# Patient Record
Sex: Female | Born: 1985 | Race: White | Hispanic: No | Marital: Married | Smoking: Never smoker
Health system: Southern US, Community
[De-identification: ages and names within clinical notes are randomized; demographics above are authoritative.]

## PROBLEM LIST (undated history)

## (undated) ENCOUNTER — Inpatient Hospital Stay (HOSPITAL_COMMUNITY): Payer: Self-pay

## (undated) DIAGNOSIS — Z8619 Personal history of other infectious and parasitic diseases: Secondary | ICD-10-CM

## (undated) DIAGNOSIS — Z789 Other specified health status: Secondary | ICD-10-CM

## (undated) DIAGNOSIS — A6920 Lyme disease, unspecified: Secondary | ICD-10-CM

## (undated) HISTORY — DX: Personal history of other infectious and parasitic diseases: Z86.19

## (undated) HISTORY — PX: WISDOM TOOTH EXTRACTION: SHX21

---
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
## 2011-04-17 LAB — HM PAP SMEAR: HM Pap smear: NORMAL

## 2011-07-04 ENCOUNTER — Ambulatory Visit: Payer: Self-pay | Admitting: Obstetrics & Gynecology

## 2011-08-21 NOTE — L&D Delivery Note (Signed)
Delivery Note At 9:53 PM a viable female was delivered via Vaginal, Spontaneous Delivery (Presentation: ; Occiput Anterior).  APGAR: 9, 9; weight 7 lb 2.3 oz (3240 g).   Placenta status: Intact, Spontaneous.  Cord: 3 vessels with the following complications: None.    Anesthesia: Epidural  Episiotomy: none Lacerations: bilateral periurethral Suture Repair: 3.0 vicryl rapide SH Est. Blood Loss (mL): 200  Mom to postpartum.  Baby to nursery-stable.  Bauer, Karla Dietz 11/24/2011, 10:17 PM

## 2011-08-31 LAB — HIV ANTIBODY (ROUTINE TESTING W REFLEX): HIV: NONREACTIVE

## 2011-09-08 LAB — STREP B DNA PROBE: GBS: NEGATIVE

## 2011-11-23 ENCOUNTER — Encounter (HOSPITAL_COMMUNITY): Payer: Self-pay

## 2011-11-23 ENCOUNTER — Inpatient Hospital Stay (HOSPITAL_COMMUNITY)
Admission: AD | Admit: 2011-11-23 | Discharge: 2011-11-26 | DRG: 775 | Disposition: A | Discharge status: Home / Self Care | Source: Ambulatory Visit | Attending: Obstetrics | Admitting: Obstetrics

## 2011-11-23 DIAGNOSIS — IMO0002 Reserved for concepts with insufficient information to code with codable children: Secondary | ICD-10-CM

## 2011-11-23 HISTORY — DX: Other specified health status: Z78.9

## 2011-11-23 NOTE — Progress Notes (Signed)
Anice Paganini, CNM will special patient if she is admitted in active labor.  Otherwise, call Dr. Gaynell Face for orders.

## 2011-11-23 NOTE — MAU Note (Signed)
Pt is going to 169 to be evaluated due to limited beds in MAU-C.Karla Bauer is Contractor

## 2011-11-24 ENCOUNTER — Encounter (HOSPITAL_COMMUNITY): Payer: Self-pay | Admitting: Anesthesiology

## 2011-11-24 ENCOUNTER — Inpatient Hospital Stay (HOSPITAL_COMMUNITY): Admitting: Anesthesiology

## 2011-11-24 ENCOUNTER — Encounter (HOSPITAL_COMMUNITY): Payer: Self-pay | Admitting: *Deleted

## 2011-11-24 DIAGNOSIS — IMO0002 Reserved for concepts with insufficient information to code with codable children: Secondary | ICD-10-CM

## 2011-11-24 LAB — CBC
HCT: 35.6 % — ABNORMAL LOW (ref 36.0–46.0)
Hemoglobin: 12 g/dL (ref 12.0–15.0)
MCHC: 33.7 g/dL (ref 30.0–36.0)
RBC: 3.93 MIL/uL (ref 3.87–5.11)
WBC: 10.9 10*3/uL — ABNORMAL HIGH (ref 4.0–10.5)

## 2011-11-24 LAB — ABO/RH: ABO/RH(D): B POS

## 2011-11-24 LAB — RPR: RPR Ser Ql: NONREACTIVE

## 2011-11-24 LAB — TYPE AND SCREEN: ABO/RH(D): B POS

## 2011-11-24 LAB — HEPATITIS B SURFACE ANTIGEN: Hepatitis B Surface Ag: NEGATIVE

## 2011-11-24 LAB — RUBELLA SCREEN: Rubella: 48.3 IU/mL — ABNORMAL HIGH

## 2011-11-24 MED ORDER — IBUPROFEN 600 MG PO TABS
600.0000 mg | ORAL_TABLET | Freq: Four times a day (QID) | ORAL | Status: DC | PRN
  Administered 2011-11-24: 600 mg via ORAL
  Filled 2011-11-24: qty 1

## 2011-11-24 MED ORDER — PHENYLEPHRINE 40 MCG/ML (10ML) SYRINGE FOR IV PUSH (FOR BLOOD PRESSURE SUPPORT)
80.0000 ug | PREFILLED_SYRINGE | INTRAVENOUS | Status: DC | PRN
  Filled 2011-11-24: qty 5

## 2011-11-24 MED ORDER — OXYTOCIN BOLUS FROM INFUSION
500.0000 mL | Freq: Once | INTRAVENOUS | Status: DC
  Filled 2011-11-24 (×2): qty 1000
  Filled 2011-11-24: qty 500

## 2011-11-24 MED ORDER — LIDOCAINE HCL (PF) 1 % IJ SOLN
INTRAMUSCULAR | Status: DC | PRN
  Administered 2011-11-24 (×2): 4 mL

## 2011-11-24 MED ORDER — TERBUTALINE SULFATE 1 MG/ML IJ SOLN: 0.2500 mg | Freq: Once | INTRAMUSCULAR | Status: AC | PRN

## 2011-11-24 MED ORDER — PHENYLEPHRINE 40 MCG/ML (10ML) SYRINGE FOR IV PUSH (FOR BLOOD PRESSURE SUPPORT)
80.0000 ug | PREFILLED_SYRINGE | INTRAVENOUS | Status: DC | PRN
  Administered 2011-11-24: 40 ug via INTRAVENOUS

## 2011-11-24 MED ORDER — OXYCODONE-ACETAMINOPHEN 5-325 MG PO TABS: 1.0000 | ORAL_TABLET | ORAL | Status: DC | PRN

## 2011-11-24 MED ORDER — ACETAMINOPHEN 325 MG PO TABS: 650.0000 mg | ORAL_TABLET | ORAL | Status: DC | PRN

## 2011-11-24 MED ORDER — EPHEDRINE 5 MG/ML INJ: 10.0000 mg | INTRAVENOUS | Status: DC | PRN

## 2011-11-24 MED ORDER — FENTANYL 2.5 MCG/ML BUPIVACAINE 1/10 % EPIDURAL INFUSION (WH - ANES)
14.0000 mL/h | INTRAMUSCULAR | Status: DC
  Filled 2011-11-24: qty 60

## 2011-11-24 MED ORDER — CITRIC ACID-SODIUM CITRATE 334-500 MG/5ML PO SOLN: 30.0000 mL | ORAL | Status: DC | PRN

## 2011-11-24 MED ORDER — FLEET ENEMA 7-19 GM/118ML RE ENEM: 1.0000 | ENEMA | RECTAL | Status: DC | PRN

## 2011-11-24 MED ORDER — OXYTOCIN 20 UNITS IN LACTATED RINGERS INFUSION - SIMPLE
1.0000 m[IU]/min | INTRAVENOUS | Status: DC
  Administered 2011-11-24: 2 m[IU]/min via INTRAVENOUS
  Administered 2011-11-24: 4 m[IU]/min via INTRAVENOUS

## 2011-11-24 MED ORDER — FENTANYL 2.5 MCG/ML BUPIVACAINE 1/10 % EPIDURAL INFUSION (WH - ANES)
INTRAMUSCULAR | Status: DC | PRN
  Administered 2011-11-24: 14 mL/h via EPIDURAL

## 2011-11-24 MED ORDER — LACTATED RINGERS IV SOLN: 500.0000 mL | INTRAVENOUS | Status: DC | PRN

## 2011-11-24 MED ORDER — ONDANSETRON HCL 4 MG/2ML IJ SOLN: 4.0000 mg | Freq: Four times a day (QID) | INTRAMUSCULAR | Status: DC | PRN

## 2011-11-24 MED ORDER — DIPHENHYDRAMINE HCL 50 MG/ML IJ SOLN: 12.5000 mg | INTRAMUSCULAR | Status: DC | PRN

## 2011-11-24 MED ORDER — LACTATED RINGERS IV SOLN
INTRAVENOUS | Status: DC
  Administered 2011-11-24 (×2): via INTRAVENOUS
  Administered 2011-11-24: 500 mL via INTRAVENOUS

## 2011-11-24 MED ORDER — BUTORPHANOL TARTRATE 2 MG/ML IJ SOLN: 1.0000 mg | INTRAMUSCULAR | Status: DC | PRN

## 2011-11-24 MED ORDER — OXYTOCIN 20 UNITS IN LACTATED RINGERS INFUSION - SIMPLE
125.0000 mL/h | Freq: Once | INTRAVENOUS | Status: AC
  Administered 2011-11-24: 999 mL/h via INTRAVENOUS

## 2011-11-24 MED ORDER — LIDOCAINE HCL (PF) 1 % IJ SOLN
30.0000 mL | INTRAMUSCULAR | Status: DC | PRN
  Administered 2011-11-24: 30 mL via SUBCUTANEOUS
  Filled 2011-11-24: qty 30

## 2011-11-24 MED ORDER — EPHEDRINE 5 MG/ML INJ
10.0000 mg | INTRAVENOUS | Status: DC | PRN
  Filled 2011-11-24: qty 4

## 2011-11-24 NOTE — Progress Notes (Signed)
OK to do VS every couple of hours, temp every 2 hrs. Pt. Is attempting natural childbirth, trying different types of position - assuming different positions that will yeild the greatest amount of comfort (rocking chair, leaning over the head of the bed, sitting on the floor with spouse rubbing her back, standing & rocking; sitting on birthing ball. Spouse at the bedside providing physical and emotional support. [name], CNM at the bedside. Pt. Currently moving around in tub - trying to get comfortable.  Pain 7/10. FHR - 130's.

## 2011-11-24 NOTE — Anesthesia Preprocedure Evaluation (Signed)

## 2011-11-24 NOTE — Progress Notes (Signed)
Pt. Vomitting, mouth care provided, pt. Has decided she wants an epidural.  Anesthesia notified.

## 2011-11-24 NOTE — Progress Notes (Signed)
[name], midwife to the bedside, pt. Desires to go to bathtub and soak.  Telemetry monitors remain in place - waterproof. Pain level 7/10; natural childbirth.

## 2011-11-24 NOTE — Progress Notes (Signed)
CNM & RN assist pt. Getting out of tub and walking around in the room; gown and linen changed - dry monitor belts and gown given.  Spouse remains at the bedside. Pt. Undecided - may want epidural.

## 2011-11-24 NOTE — Progress Notes (Signed)
Karla Bauer is a 26 y.o. G1P0 at [redacted]w[redacted]d by LMP admitted for rupture of membranes  Subjective: Feeling rectal pressure and urge to push.   Objective: BP 101/65  Pulse 97  Temp(Src) 98.5 F (36.9 C) (Axillary)  Resp 18  Ht 5\' 5"  (1.651 m)  Wt 73.936 kg (163 lb)  BMI 27.12 kg/m2  SpO2 100%      FHT:  FHR: 130 bpm, variability: moderate,  accelerations:  Present,  decelerations:  Present variable UC:   regular, every 2 minutes, Pitocin at 80mu/min SVE:   Dilation: 10 Effacement (%): 100 Station: 0 Exam by:: [name], CNM  Labs: Lab Results  Component Value Date   WBC 10.9* 11/24/2011   HGB 12.0 11/24/2011   HCT 35.6* 11/24/2011   MCV 90.6 11/24/2011   PLT 150 11/24/2011    Assessment / Plan: Augmentation of labor, progressing well, ROM x 24hours, afebrile.   Labor: Progressing normally Preeclampsia:  no signs or symptoms of toxicity Fetal Wellbeing:  Category I Pain Control:  Epidural I/D:  n/a Anticipated MOD:  NSVD  [name], Kaydra Borgen 11/24/2011, 8:32 PM

## 2011-11-24 NOTE — H&P (Signed)
Karla Bauer is a 26 y.o. year old G1P0 female at [redacted]w[redacted]d weeks gestation who presents to MAU reporting Spontaneous rupture of membranes. Maternal Medical History:  Reason for admission: Reason for admission: rupture of membranes.  Reason for Admission:   nauseaContractions: Onset was 6-12 hours ago.   Frequency: irregular.   Perceived severity is mild.    Fetal activity: Perceived fetal activity is normal.   Last perceived fetal movement was within the past hour.    Prenatal complications: no prenatal complications Prenatal Complications - Diabetes: none.    OB History    Grav Para Term Preterm Abortions TAB SAB Ect Mult Living   1              Past Medical History  Diagnosis Date  . No pertinent past medical history    Past Surgical History  Procedure Date  . Plastic surgery on face  2001  . Wisdom tooth extraction    Family History: family history is not on file. Social History:  reports that she has never smoked. She does not have any smokeless tobacco history on file. She reports that she does not drink alcohol or use illicit drugs.  Review of Systems  Constitutional: Negative for fever.  Eyes: Negative for blurred vision.  Gastrointestinal: Positive for abdominal pain (intermittent contractions). Negative for nausea and vomiting.  Musculoskeletal: Positive for back pain (mild back pain).  Neurological: Negative for dizziness and headaches.    Dilation: 3 Effacement (%): 80 Station: -1 Exam by:: [name], CNM Blood pressure 124/84, pulse 89, temperature 97.9 F (36.6 C), temperature source Oral, resp. rate 18, height 5\' 5"  (1.651 m), weight 73.936 kg (163 lb). Maternal Exam:  Uterine Assessment: Contraction strength is mild.  Contraction frequency is irregular.  Contractions q8-21mins  Abdomen: Estimated fetal weight is 7lbs.   Fetal presentation: vertex  Introitus: Normal vulva. Normal vagina.  Amniotic fluid character: clear.  Pelvis: adequate for delivery.    Cervix: Cervix evaluated by digital exam.     Fetal Exam Fetal Monitor Review: Mode: ultrasound.   Baseline rate: 140.  Variability: moderate (6-25 bpm).   Pattern: accelerations present and no decelerations.       Physical Exam  Constitutional: She is oriented to person, place, and time. She appears well-developed and well-nourished. No distress.  HENT:  Head: Normocephalic and atraumatic.  Eyes: Pupils are equal, round, and reactive to light.  Neck: Normal range of motion.  Cardiovascular: Normal rate and regular rhythm.   Respiratory: Effort normal and breath sounds normal.  GI: Soft. Bowel sounds are normal. There is no tenderness.       Gravid  Genitourinary: Vagina normal and uterus normal.  Musculoskeletal: Normal range of motion.  Neurological: She is alert and oriented to person, place, and time. She has normal reflexes.  Skin: Skin is warm and dry.  Psychiatric: She has a normal mood and affect. Her behavior is normal. Judgment and thought content normal.    Prenatal labs: ABO, Rh: --/--/B POS (04/06 0300) Antibody: NEG (04/06 0249) Rubella:   RPR: NON REACTIVE (04/06 0100)  HBsAg:    HIV: Non-reactive (01/11 0000)  GBS: Negative (01/19 0000)   Assessment/Plan: Latent labor with SROM of clear fluid at 2030 last night. Will augment with Pitocin. Neg GBS.    [name], Masiya Claassen 11/24/2011, 7:41 AM

## 2011-11-24 NOTE — Progress Notes (Signed)
Karla Bauer is a 26 y.o. G1P0 at [redacted]w[redacted]d by LMP admitted for rupture of membranes  Subjective: Feeling some discomfort with contractions, frequent position changes. Currently alternating birthing ball and standing at bedside.   Objective: BP 113/81  Pulse 122  Temp(Src) 97.4 F (36.3 C) (Axillary)  Resp 18  Ht 5\' 5"  (1.651 m)  Wt 73.936 kg (163 lb)  BMI 27.12 kg/m2      FHT:  FHR: 135 bpm, variability: moderate,  accelerations:  Present,  decelerations:  Absent UC:   regular, every 3-5 minutes, Pitocin at 47mu/min SVE:   Dilation: 4 Effacement (%): 80 Station: -1 Exam by:: [name], CNM  Labs: Lab Results  Component Value Date   WBC 10.9* 11/24/2011   HGB 12.0 11/24/2011   HCT 35.6* 11/24/2011   MCV 90.6 11/24/2011   PLT 150 11/24/2011    Assessment / Plan: Augmentation of labor, progressing well  Labor: Progressing normally Preeclampsia:  no signs or symptoms of toxicity Fetal Wellbeing:  Category I Pain Control:  Labor support without medications I/D:  n/a Anticipated MOD:  NSVD  [name], Aydeen Blume 11/24/2011, 11:03 AM

## 2011-11-24 NOTE — Anesthesia Procedure Notes (Signed)
Epidural Patient location during procedure: OB Start time: 11/24/2011 6:26 PM  Staffing Anesthesiologist: Keyler Hoge A. Performed by: anesthesiologist   Preanesthetic Checklist Completed: patient identified, site marked, surgical consent, pre-op evaluation, timeout performed, IV checked, risks and benefits discussed and monitors and equipment checked  Epidural Patient position: sitting Prep: site prepped and draped and DuraPrep Patient monitoring: continuous pulse ox and blood pressure Approach: midline Injection technique: LOR air  Needle:  Needle type: Tuohy  Needle gauge: 17 G Needle length: 9 cm Needle insertion depth: 5 cm cm Catheter type: closed end flexible Catheter size: 19 Gauge Catheter at skin depth: 10 cm Test dose: negative and Other  Assessment Events: blood not aspirated, injection not painful, no injection resistance, negative IV test and no paresthesia  Additional Notes Patient identified. Risks and benefits discussed including failed block, incomplete  Pain control, post dural puncture headache, nerve damage, paralysis, blood pressure Changes, nausea, vomiting, reactions to medications-both toxic and allergic and post Partum back pain. All questions were answered. Patient expressed understanding and wished to proceed. Sterile technique was used throughout procedure. Epidural site was Dressed with sterile barrier dressing. No paresthesias, signs of intravascular injection Or signs of intrathecal spread were encountered.  Patient was more comfortable after the epidural was dosed. Please see RN's note for documentation of vital signs and FHR which are stable.

## 2011-11-24 NOTE — Progress Notes (Signed)
Karla Bauer is a 26 y.o. G1P0 at [redacted]w[redacted]d by LMP admitted for rupture of membranes  Subjective: Increased pain with contractions since AROM, frequent position changes. Desires to be checked.   Objective: BP 120/78  Pulse 80  Temp(Src) 97.4 F (36.3 C) (Oral)  Resp 20  Ht 5\' 5"  (1.651 m)  Wt 73.936 kg (163 lb)  BMI 27.12 kg/m2      FHT:  FHR: 140 bpm, variability: moderate,  accelerations:  Present,  decelerations:  Absent UC:   regular, every 2-3 minutes, Pitocin at 26mu/min SVE:   Dilation: 5 Effacement (%): 100 Station: -1 Exam by:: Bauer  Labs: Lab Results  Component Value Date   WBC 10.9* 11/24/2011   HGB 12.0 11/24/2011   HCT 35.6* 11/24/2011   MCV 90.6 11/24/2011   PLT 150 11/24/2011    Assessment / Plan: Augmentation of labor, progressing well, Pt up to tub, SVE 5-6cm.   Labor: Progressing normally Preeclampsia:  no signs or symptoms of toxicity Fetal Wellbeing:  Category I Pain Control:  Labor support without medications I/D:  n/a Anticipated MOD:  NSVD  Bauer, Karla Maris 11/24/2011, 4:30 PM

## 2011-11-24 NOTE — Progress Notes (Signed)
Karla Bauer is a 26 y.o. G1P0 at [redacted]w[redacted]d by LMP admitted for rupture of membranes  Subjective: Pt comfortable after epidural placement, feeling some rectal pressure now.   Objective: BP 93/76  Pulse 92  Temp(Src) 97.9 F (36.6 C) (Oral)  Resp 18  Ht 5\' 5"  (1.651 m)  Wt 73.936 kg (163 lb)  BMI 27.12 kg/m2  SpO2 100%      FHT:  FHR: 135 bpm, variability: moderate,  accelerations:  Present,  decelerations:  Absent UC:   regular, every 2-3 minutes, pitocin at 80mu/min SVE:   Dilation: 8 Effacement (%): 100 Station: 0 Exam by:: [name]  Labs: Lab Results  Component Value Date   WBC 10.9* 11/24/2011   HGB 12.0 11/24/2011   HCT 35.6* 11/24/2011   MCV 90.6 11/24/2011   PLT 150 11/24/2011    Assessment / Plan: Augmentation of labor, progressing well  Labor: Progressing normally Preeclampsia:  no signs or symptoms of toxicity Fetal Wellbeing:  Category I Pain Control:  Epidural I/D:  n/a Anticipated MOD:  NSVD  [name], Thomasine Klutts 11/24/2011, 7:12 PM

## 2011-11-24 NOTE — Progress Notes (Signed)
Karla Bauer is a 26 y.o. G1P0 at [redacted]w[redacted]d by LMP admitted for rupture of membranes  Subjective: Increasing discomfort with contractions.   Objective: BP 124/82  Pulse 120  Temp(Src) 97.4 F (36.3 C) (Axillary)  Resp 18  Ht 5\' 5"  (1.651 m)  Wt 73.936 kg (163 lb)  BMI 27.12 kg/m2      FHT:  FHR: 135 bpm, variability: moderate,  accelerations:  Present,  decelerations:  Absent UC:   regular, every 2-3 minutes SVE:   Dilation: 4.5 Effacement (%): 90 Station: -1 Exam by:: becky hamby, cnm  Labs: Lab Results  Component Value Date   WBC 10.9* 11/24/2011   HGB 12.0 11/24/2011   HCT 35.6* 11/24/2011   MCV 90.6 11/24/2011   PLT 150 11/24/2011    Assessment / Plan: Augmentation of labor, progressing well, Forebag AROM, clear fluid. Head well applied. Latent labor  Labor: Progressing normally Preeclampsia:  no signs or symptoms of toxicity Fetal Wellbeing:  Category I Pain Control:  Labor support without medications I/D:  n/a Anticipated MOD:  NSVD  [name], Terrian Sentell 11/24/2011, 2:01 PM

## 2011-11-25 ENCOUNTER — Encounter (HOSPITAL_COMMUNITY): Payer: Self-pay | Admitting: *Deleted

## 2011-11-25 LAB — CBC
Hemoglobin: 11.9 g/dL — ABNORMAL LOW (ref 12.0–15.0)
MCH: 30.4 pg (ref 26.0–34.0)
MCV: 90.6 fL (ref 78.0–100.0)
Platelets: 145 10*3/uL — ABNORMAL LOW (ref 150–400)
RBC: 3.92 MIL/uL (ref 3.87–5.11)
WBC: 15 10*3/uL — ABNORMAL HIGH (ref 4.0–10.5)

## 2011-11-25 MED ORDER — SENNOSIDES-DOCUSATE SODIUM 8.6-50 MG PO TABS
2.0000 | ORAL_TABLET | Freq: Every day | ORAL | Status: DC
  Administered 2011-11-25: 2 via ORAL

## 2011-11-25 MED ORDER — SODIUM CHLORIDE 0.9 % IV SOLN: 250.0000 mL | INTRAVENOUS | Status: DC | PRN

## 2011-11-25 MED ORDER — TETANUS-DIPHTH-ACELL PERTUSSIS 5-2.5-18.5 LF-MCG/0.5 IM SUSP: 0.5000 mL | Freq: Once | INTRAMUSCULAR | Status: DC

## 2011-11-25 MED ORDER — OXYCODONE-ACETAMINOPHEN 5-325 MG PO TABS
1.0000 | ORAL_TABLET | ORAL | Status: DC | PRN
  Administered 2011-11-26: 1 via ORAL
  Filled 2011-11-25: qty 1

## 2011-11-25 MED ORDER — DIPHENHYDRAMINE HCL 25 MG PO CAPS: 25.0000 mg | ORAL_CAPSULE | Freq: Four times a day (QID) | ORAL | Status: DC | PRN

## 2011-11-25 MED ORDER — BENZOCAINE-MENTHOL 20-0.5 % EX AERO: 1.0000 "application " | INHALATION_SPRAY | CUTANEOUS | Status: DC | PRN

## 2011-11-25 MED ORDER — BENZOCAINE-MENTHOL 20-0.5 % EX AERO
INHALATION_SPRAY | CUTANEOUS | Status: AC
  Filled 2011-11-25: qty 56

## 2011-11-25 MED ORDER — SODIUM CHLORIDE 0.9 % IJ SOLN: 3.0000 mL | Freq: Two times a day (BID) | INTRAMUSCULAR | Status: DC

## 2011-11-25 MED ORDER — SODIUM CHLORIDE 0.9 % IJ SOLN: 3.0000 mL | INTRAMUSCULAR | Status: DC | PRN

## 2011-11-25 MED ORDER — ONDANSETRON HCL 4 MG/2ML IJ SOLN: 4.0000 mg | INTRAMUSCULAR | Status: DC | PRN

## 2011-11-25 MED ORDER — OXYTOCIN 20 UNITS IN LACTATED RINGERS INFUSION - SIMPLE: 125.0000 mL/h | INTRAVENOUS | Status: DC | PRN

## 2011-11-25 MED ORDER — SIMETHICONE 80 MG PO CHEW: 80.0000 mg | CHEWABLE_TABLET | ORAL | Status: DC | PRN

## 2011-11-25 MED ORDER — PRENATAL MULTIVITAMIN CH
1.0000 | ORAL_TABLET | Freq: Every day | ORAL | Status: DC
  Administered 2011-11-25 – 2011-11-26 (×2): 1 via ORAL
  Filled 2011-11-25: qty 1

## 2011-11-25 MED ORDER — IBUPROFEN 600 MG PO TABS
600.0000 mg | ORAL_TABLET | Freq: Four times a day (QID) | ORAL | Status: DC
  Administered 2011-11-25 – 2011-11-26 (×5): 600 mg via ORAL
  Filled 2011-11-25 (×5): qty 1

## 2011-11-25 MED ORDER — ZOLPIDEM TARTRATE 5 MG PO TABS: 5.0000 mg | ORAL_TABLET | Freq: Every evening | ORAL | Status: DC | PRN

## 2011-11-25 MED ORDER — ONDANSETRON HCL 4 MG PO TABS: 4.0000 mg | ORAL_TABLET | ORAL | Status: DC | PRN

## 2011-11-25 MED ORDER — LANOLIN HYDROUS EX OINT: TOPICAL_OINTMENT | CUTANEOUS | Status: DC | PRN

## 2011-11-25 MED ORDER — DIBUCAINE 1 % RE OINT: 1.0000 "application " | TOPICAL_OINTMENT | RECTAL | Status: DC | PRN

## 2011-11-25 MED ORDER — WITCH HAZEL-GLYCERIN EX PADS: 1.0000 "application " | MEDICATED_PAD | CUTANEOUS | Status: DC | PRN

## 2011-11-25 NOTE — Progress Notes (Signed)
Patient ID: Karla Bauer, female   DOB: 1986-07-16, 26 y.o.   MRN: [id number] Postpartum day one Vital signs normal Fundus firm Lochia moderate No complaints

## 2011-11-25 NOTE — H&P (Signed)
  Newborn Admission [facility] [address] Szymczak is a  female infant born at Gestational Age: <None>.  Mother, This patient's mother is not on file., is a This patient's mother is not on file. This patient's mother is not on file.. This patient's mother is not on file. Prenatal labs: ABO, Rh: This patient's mother is not on file.This patient's mother is not on file. Antibody: This patient's mother is not on file. Rubella: This patient's mother is not on file. RPR: This patient's mother is not on file. HBsAg: This patient's mother is not on file. HIV: This patient's mother is not on file. GBS: This patient's mother is not on file. Prenatal care: good.  Pregnancy complications: none Delivery complications: Marland Kitchen Maternal antibiotics: This patient's mother is not on file. Route of delivery: . Apgar scores:  at 1 minute,  at 5 minutes.  ROM: , , , . Newborn Measurements:  Weight:  Length:  Head Circumference:  in Chest Circumference:  in Normalized weight-for-age data available only for age 63 to 20 years.  Objective: Blood pressure 123/72, pulse 64, temperature 98.3 F (36.8 C), temperature source Oral, resp. rate 16, height 5\' 5"  (1.651 m), weight 73.936 kg (163 lb), SpO2 100.00%, unknown if currently breastfeeding. Physical Exam:  Head: normal  Eyes: red reflex deferred  Ears: normal  Mouth/Oral: palate intact  Neck: normal  Chest/Lungs: normal  Heart/Pulse: no murmur Abdomen/Cord: non-distended  Genitalia: normal female  Skin & Color: normal  Neurological: +suck, grasp and moro reflex  Skeletal: clavicles palpated, no crepitus and no hip subluxation  Other:   Assessment and Plan: Patient Active Problem List  Diagnoses Date Noted  . SROM (spontaneous rupture of membranes) 11/24/2011    Normal newborn care Lactation to see mom Hearing screen and first hepatitis B vaccine prior to discharge  Wilber Bihari, MD  11/25/2011, 9:47 AM

## 2011-11-25 NOTE — Anesthesia Postprocedure Evaluation (Signed)
  Anesthesia Post-op Note  Patient: Karla Bauer  Procedure(s) Performed: * No procedures listed *  Patient Location: Mother/Baby  Anesthesia Type: Epidural  Level of Consciousness: awake  Airway and Oxygen Therapy: Patient Spontanous Breathing  Post-op Pain: mild  Post-op Assessment: Patient's Cardiovascular Status Stable and Respiratory Function Stable  Post-op Vital Signs: stable  Complications: No apparent anesthesia complications

## 2011-11-26 MED ORDER — IBUPROFEN 600 MG PO TABS: 600.0000 mg | ORAL_TABLET | Freq: Four times a day (QID) | ORAL | Status: DC

## 2011-11-26 MED ORDER — OXYCODONE-ACETAMINOPHEN 5-325 MG PO TABS: 1.0000 | ORAL_TABLET | ORAL | Status: AC | PRN

## 2011-11-26 NOTE — Discharge Summary (Signed)
Obstetric Discharge Summary Reason for Admission: onset of labor Prenatal Procedures: ultrasound Intrapartum Procedures: spontaneous vaginal delivery Postpartum Procedures: none Complications-Operative and Postpartum: none Hemoglobin  Date Value Range Status  11/25/2011 11.9* 12.0-15.0 (g/dL) Final     HCT  Date Value Range Status  11/25/2011 35.5* 36.0-46.0 (%) Final    Physical Exam:  General: alert and no distress Lochia: appropriate Uterine Fundus: firm Incision: healing well DVT Evaluation: No evidence of DVT seen on physical exam.  Discharge Diagnoses: Term Pregnancy-delivered  Discharge Information: Date: 11/26/2011 Activity: pelvic rest Diet: routine Medications: PNV, Ibuprofen, Colace and Percocet Condition: stable Instructions: refer to practice specific booklet Discharge to: home Follow-up Information    Schedule an appointment as soon as possible for a visit with Brock Bad, MD.   Contact information:   [address] [place] [address] [phone]          Newborn Data: Live born female  Birth Weight: 7 lb 2.3 oz (3240 g) APGAR: 9, 9  Home with mother.  Arrianna Catala A 11/26/2011, 8:15 AM

## 2011-11-26 NOTE — Progress Notes (Signed)
UR chart review completed.  

## 2011-11-26 NOTE — Progress Notes (Signed)
Post Partum Day 2 Subjective: no complaints  Objective: Blood pressure 114/73, pulse 63, temperature 98.2 F (36.8 C), temperature source Oral, resp. rate 18, height 5\' 5"  (1.651 m), weight 73.936 kg (163 lb), SpO2 100.00%, unknown if currently breastfeeding.  Physical Exam:  General: alert and no distress Lochia: appropriate Uterine Fundus: firm Incision: healing well DVT Evaluation: No evidence of DVT seen on physical exam.   Basename 11/25/11 0535 11/24/11 0100  HGB 11.9* 12.0  HCT 35.5* 35.6*    Assessment/Plan: Discharge home   LOS: 3 days   Chrystina Naff A 11/26/2011, 8:07 AM

## 2012-04-23 IMAGING — US US OB US >=[ID] SNGL FETUS
1 series · 17 of 28 positions shown · non-contrast
Comparison: none

REASON FOR EXAM: SMALLER THAN DATES
COMMENTS:

[Series 1: us ob us >=(id) sngl fetus · 17 of 95 slices shown]
[im 1/95]
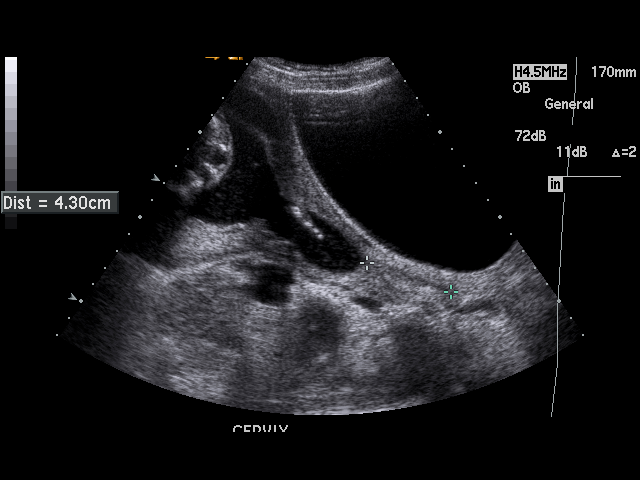
[im 7/95]
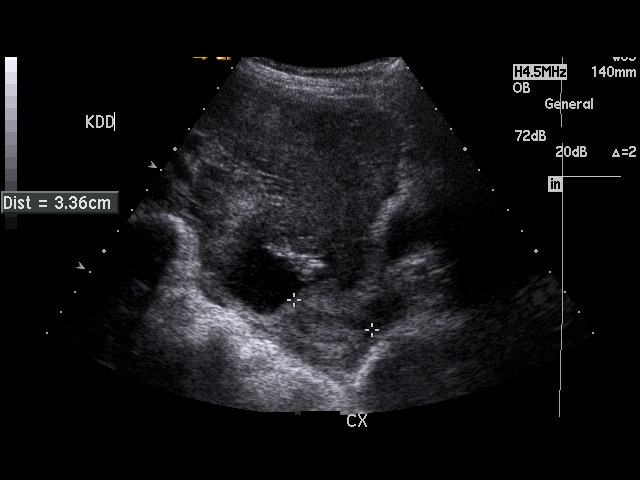
[im 14/95]
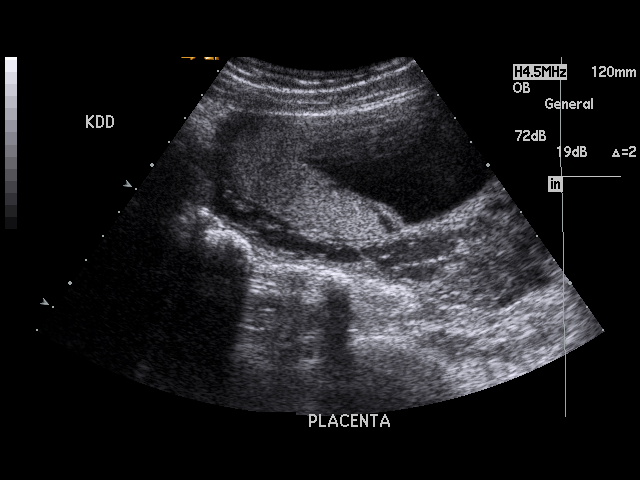
[im 18/95]
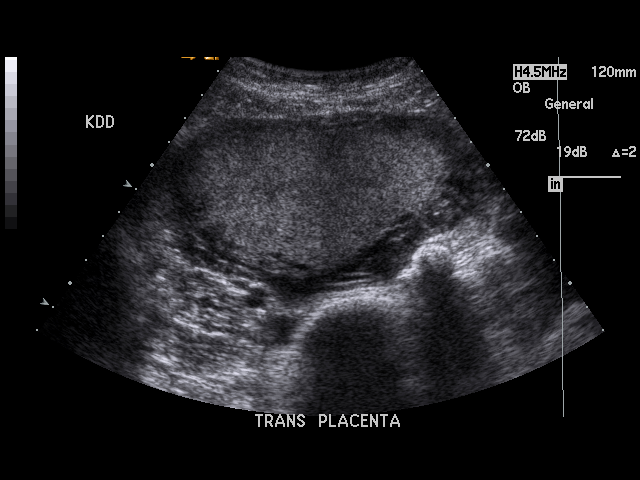
[im 25/95]
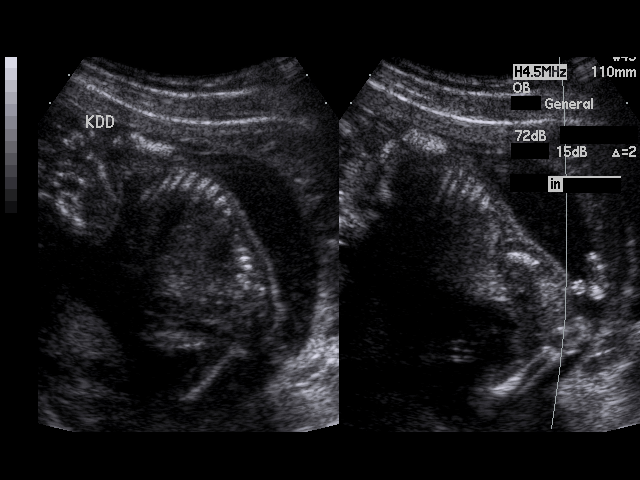
[im 32/95]
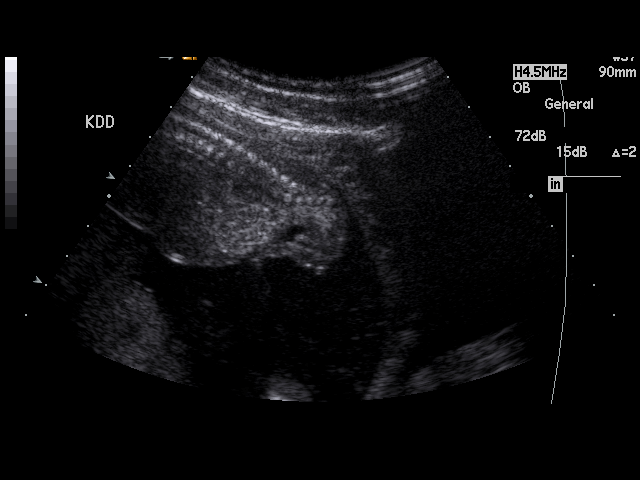
[im 35/95]
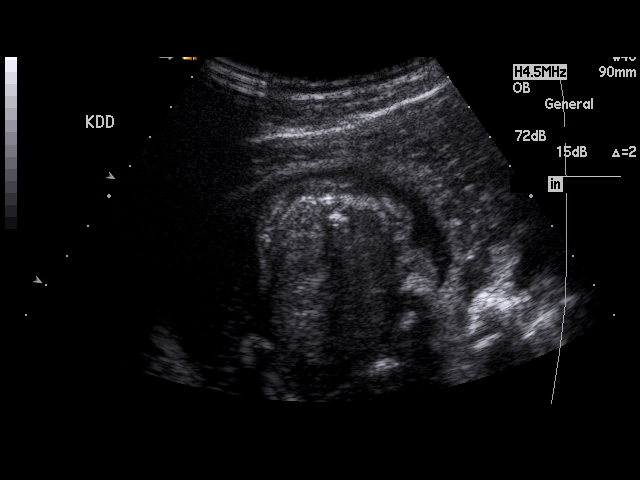
[im 42/95]
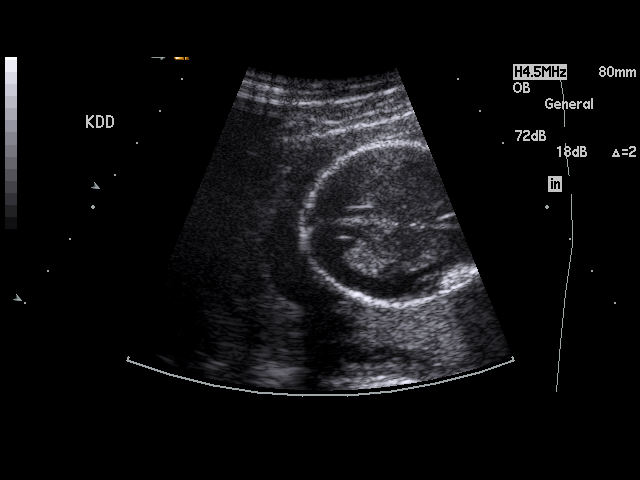
[im 49/95]
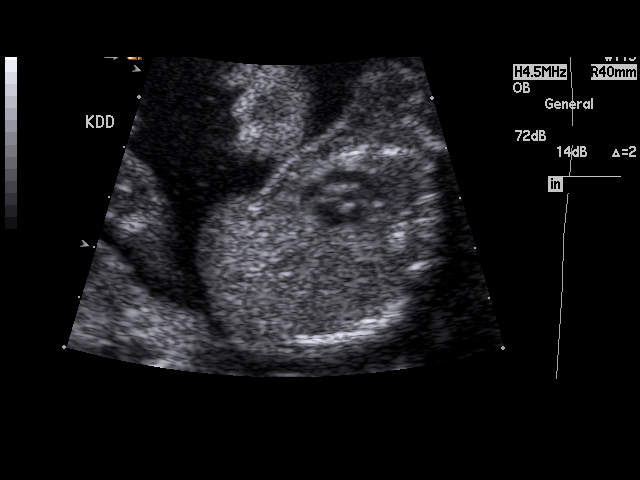
[im 53/95]
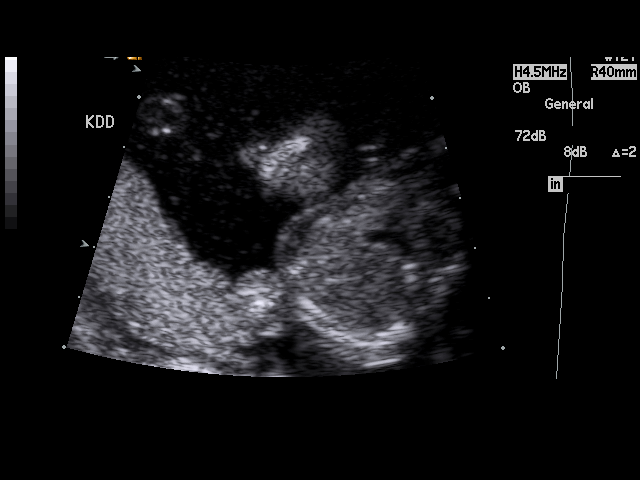
[im 60/95]
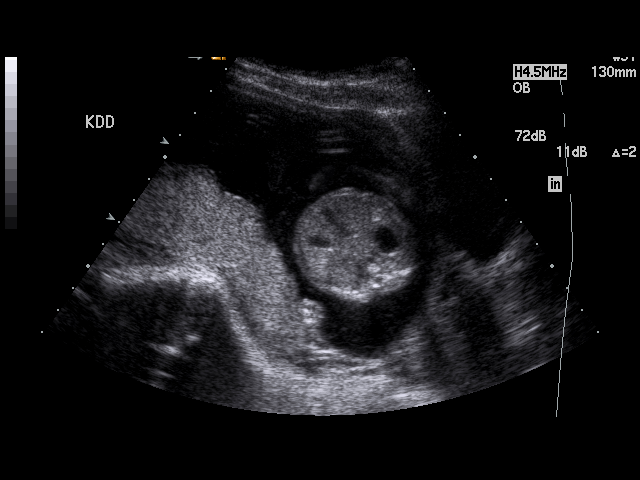
[im 63/95]
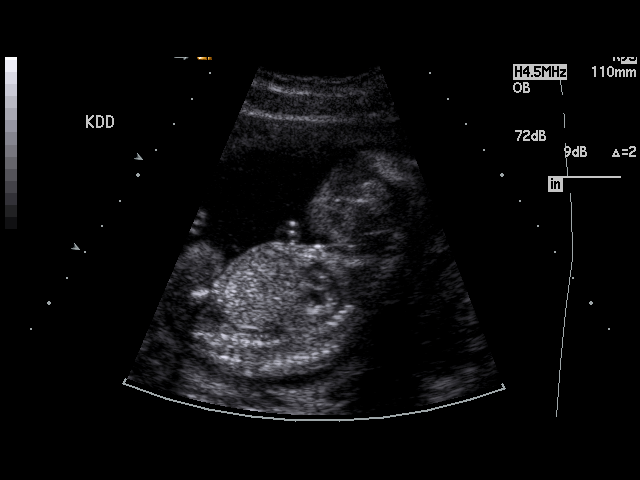
[im 70/95]
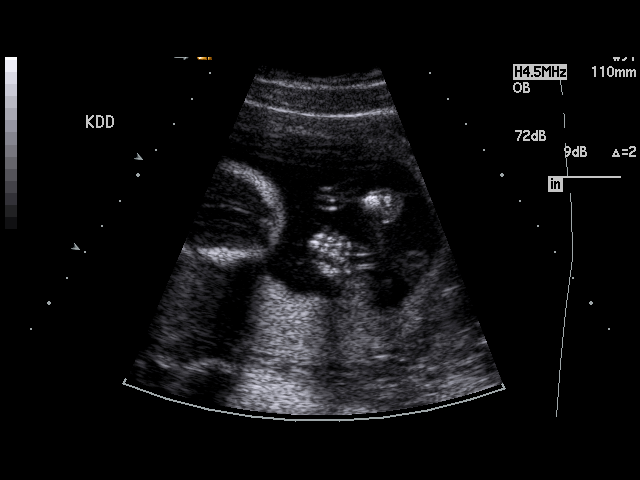
[im 77/95]
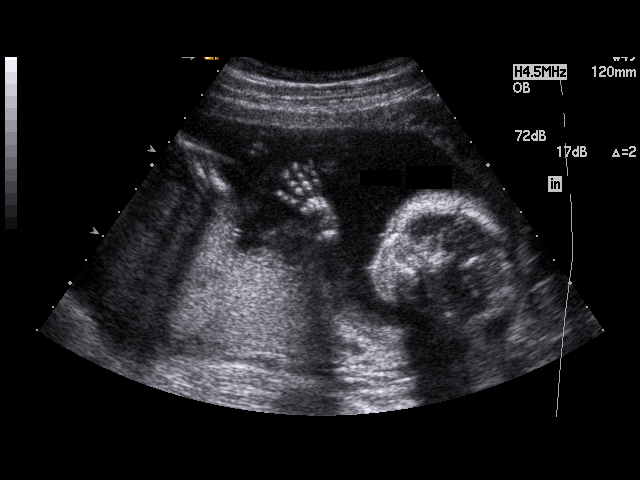
[im 81/95]
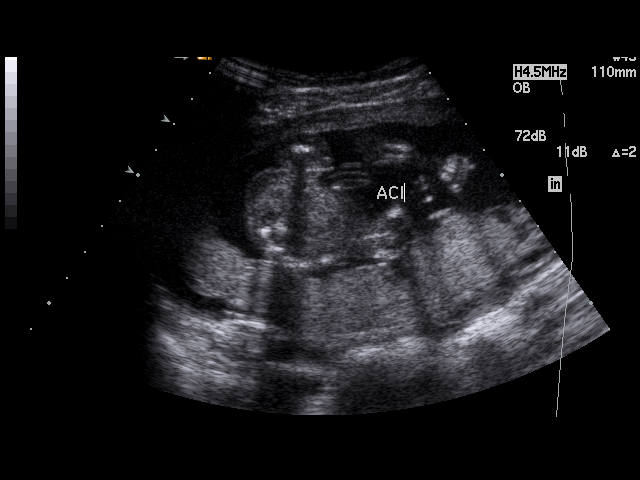
[im 88/95]
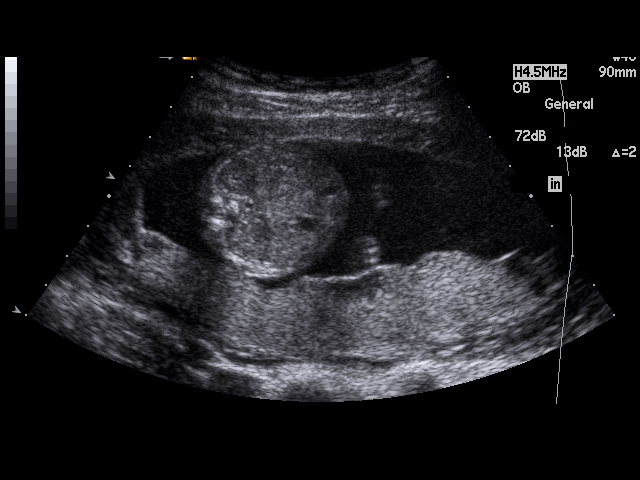
[im 95/95]
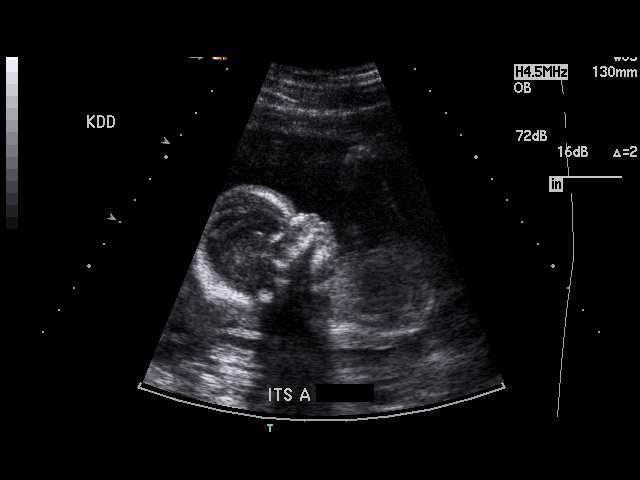

[17 of 28 positions shown; findings below may reference images not displayed]

PROCEDURE:     US  - US OB GREATER/OR EQUAL TO 66Z87  - July 04, 2011  [DATE]

RESULT:     There is observed a single living intrauterine gestation. Fetal
heart was monitored at 156 beats per minute. Presentation is breach. The
placenta is high and fundal in location. The inferior tip of the placenta is
approximately 5.78 cm from the cervix. Amnionic fluid volume appears normal.
The fetal heart, stomach, and urinary bladder are visualized. No
hydrocephalus or hydronephrosis is seen. No fetal abnormalities are
detected. Fetal measurements are as follows:

BPD: 4.14 cm corresponding to 18 weeks-7 days
HC: 15.57 cm corresponding to 18 weeks-7 days
AC: 13.06 cm corresponding to 18 weeks-7 days
FL: 2.67 cm corresponding to 18 weeks-9 day
HL: 2.54 cm corresponding to 18 weeks-3 days

EFW is 262 grams + / - 35 grams. Average ultrasound age is 18 weeks-9 days.
Ultrasound EDD is December 03, 2011.
IMPRESSION: 1.     Please see above.

## 2012-08-20 NOTE — L&D Delivery Note (Signed)
Delivery Note At 8:38 PM a viable female was delivered via Vaginal, Spontaneous Delivery (Presentation: Vertex-LOA ;  ).  APGAR: 9, 9; weight: 3289grams .   Placenta status: Intact, Spontaneous.  Cord: 3 vessels with the following complications: None.  Cord pH: none  Anesthesia: Epidural  Episiotomy: None Lacerations: 1st degree-supraurethral Suture Repair: 3.0 vicryl rapide Est. Blood Loss (mL): 350  Mom to postpartum.  Baby to Couplet care / Skin to Skin.  HARPER,CHARLES A 07/18/2013, 9:00 PM

## 2012-12-10 ENCOUNTER — Encounter: Payer: Self-pay | Admitting: Obstetrics & Gynecology

## 2012-12-10 ENCOUNTER — Ambulatory Visit (INDEPENDENT_AMBULATORY_CARE_PROVIDER_SITE_OTHER): Admitting: Obstetrics & Gynecology

## 2012-12-10 VITALS — BP 119/80 | Temp 97.0°F | Wt 122.0 lb

## 2012-12-10 DIAGNOSIS — Z3481 Encounter for supervision of other normal pregnancy, first trimester: Secondary | ICD-10-CM

## 2012-12-10 DIAGNOSIS — Z3201 Encounter for pregnancy test, result positive: Secondary | ICD-10-CM

## 2012-12-10 DIAGNOSIS — Z3480 Encounter for supervision of other normal pregnancy, unspecified trimester: Secondary | ICD-10-CM

## 2012-12-10 DIAGNOSIS — N912 Amenorrhea, unspecified: Secondary | ICD-10-CM

## 2012-12-10 LAB — POCT URINALYSIS DIPSTICK
Ketones, UA: NEGATIVE
Leukocytes, UA: NEGATIVE
Protein, UA: NEGATIVE
Urobilinogen, UA: NEGATIVE
pH, UA: 7

## 2012-12-10 LAB — OB RESULTS CONSOLE GC/CHLAMYDIA
Chlamydia: NEGATIVE
Gonorrhea: NEGATIVE

## 2012-12-10 LAB — POCT URINE PREGNANCY: Preg Test, Ur: POSITIVE

## 2012-12-10 MED ORDER — DOXYLAMINE-PYRIDOXINE 10-10 MG PO TBEC: 10.0000 mg | DELAYED_RELEASE_TABLET | Freq: Every evening | ORAL | Status: DC | PRN

## 2012-12-10 NOTE — Progress Notes (Signed)
Pulse-110  Subjective:    Karla Bauer is being seen today for her first obstetrical visit.  This is a planned pregnancy. She is at [redacted]w[redacted]d gestation. Relationship with FOB: spouse, living together. Patient does intend to breast feed. Pregnancy history fully reviewed.  Menstrual History: OB History   Grav Para Term Preterm Abortions TAB SAB Ect Mult Living   2 1 1       1       Menarche age: 27  Patient's last menstrual period was 10/17/2012.    The following portions of the patient's history were reviewed and updated as appropriate: allergies, current medications, past family history, past medical history, past social history, past surgical history and problem list.  Review of Systems Pertinent items are noted in HPI.    Objective:   General Appearance:    Alert, cooperative, no distress, appears stated age  Head:    Normocephalic, without obvious abnormality, atraumatic  Eyes:    PERRL, conjunctiva/corneas clear, EOM's intact, fundi    benign, both eyes  Ears:    Normal TM's and external ear canals, both ears  Nose:   Nares normal, septum midline, mucosa normal, no drainage    or sinus tenderness  Throat:   Lips, mucosa, and tongue normal; teeth and gums normal  Neck:   Supple, symmetrical, trachea midline, no adenopathy;    thyroid:  no enlargement/tenderness/nodules; no carotid   bruit or JVD  Back:     Symmetric, no curvature, ROM normal, no CVA tenderness  Lungs:     Clear to auscultation bilaterally, respirations unlabored  Chest Wall:    No tenderness or deformity   Heart:    Regular rate and rhythm, S1 and S2 normal, no murmur, rub   or gallop  Breast Exam:    No tenderness, masses, or nipple abnormality  Abdomen:     Soft, non-tender, bowel sounds active all four quadrants,    no masses, no organomegaly  Genitalia:    Normal female without lesion, discharge or tenderness  Extremities:   Extremities normal, atraumatic, no cyanosis or edema  Pulses:   2+ and symmetric  all extremities  Skin:   Skin color, texture, turgor normal, no rashes or lesions  Lymph nodes:   Cervical, supraclavicular, and axillary nodes normal  Neurologic:   CNII-XII intact, normal strength, sensation and reflexes    throughout    Assessment:    Pregnancy at [redacted]w[redacted]d weeks    Plan:    Initial labs drawn. Prenatal vitamins. Problem list reviewed and updated.  Follow up in 6 weeks. 50% of 20 min visit spent on counseling and coordination of care.   Marland Kitchen

## 2012-12-10 NOTE — Patient Instructions (Signed)
Hyperemesis Gravidarum  Hyperemesis gravidarum is a severe form of nausea and vomiting that happens during pregnancy. Hyperemesis is worse than morning sickness. It may cause a woman to have nausea or vomiting all day for many days. It may keep a woman from eating and drinking enough food and liquids. Hyperemesis usually occurs during the first half (the first 20 weeks) of pregnancy. It often goes away once a woman is in her second half of pregnancy. However, sometimes hyperemesis continues through an entire pregnancy.   CAUSES   The cause of this condition is not completely known but is thought to be due to changes in the body's hormones when pregnant. It could be the high level of the pregnancy hormone or an increase in estrogen in the body.   SYMPTOMS    Severe nausea and vomiting.   Nausea that does not go away.   Vomiting that does not allow you to keep any food down.   Weight loss and body fluid loss (dehydration).   Having no desire to eat or not liking food you have previously enjoyed.  DIAGNOSIS   Your caregiver may ask you about your symptoms. Your caregiver may also order blood tests and urine tests to make sure something else is not causing the problem.   TREATMENT   You may only need medicine to control the problem. If medicines do not control the nausea and vomiting, you will be treated in the hospital to prevent dehydration, acidosis, weight loss, and changes in the electrolytes in your body that may harm the unborn baby (fetus). You may need intravenous (IV) fluids.   HOME CARE INSTRUCTIONS    Take all medicine as directed by your caregiver.   Try eating a couple of dry crackers or toast in the morning before getting out of bed.   Avoid foods and smells that upset your stomach.   Avoid fatty and spicy foods. Eat 5 to 6 small meals a day.   Do not drink when eating meals. Drink between meals.   For snacks, eat high protein foods, such as cheese. Eat or suck on things that have ginger in  them. Ginger helps nausea.   Avoid food preparation. The smell of food can spoil your appetite.   Avoid iron pills and iron in your multivitamins until after 3 to 4 months of being pregnant.  SEEK MEDICAL CARE IF:    Your abdominal pain increases since the last time you saw your caregiver.   You have a severe headache.   You develop vision problems.   You feel you are losing weight.  SEEK IMMEDIATE MEDICAL CARE IF:    You are unable to keep fluids down.   You vomit blood.   You have constant nausea and vomiting.   You have a fever.   You have excessive weakness, dizziness, fainting, or extreme thirst.  MAKE SURE YOU:    Understand these instructions.   Will watch your condition.   Will get help right away if you are not doing well or get worse.  Document Released: 08/06/2005 Document Revised: 10/29/2011 Document Reviewed: 11/06/2010  [employer] Patient Information 2013 [employer].

## 2012-12-11 LAB — OBSTETRIC PANEL
Antibody Screen: NEGATIVE
Eosinophils Relative: 1 % (ref 0–5)
HCT: 41.9 % (ref 36.0–46.0)
Hemoglobin: 14.2 g/dL (ref 12.0–15.0)
Lymphocytes Relative: 17 % (ref 12–46)
Lymphs Abs: 1.6 10*3/uL (ref 0.7–4.0)
MCV: 88.4 fL (ref 78.0–100.0)
Monocytes Absolute: 0.6 10*3/uL (ref 0.1–1.0)
Monocytes Relative: 6 % (ref 3–12)
Neutro Abs: 7.1 10*3/uL (ref 1.7–7.7)
RDW: 13.4 % (ref 11.5–15.5)
Rh Type: POSITIVE
Rubella: 2.25 Index — ABNORMAL HIGH (ref <0.90)
WBC: 9.4 10*3/uL (ref 4.0–10.5)

## 2012-12-11 LAB — VITAMIN D 25 HYDROXY (VIT D DEFICIENCY, FRACTURES): Vit D, 25-Hydroxy: 35 ng/mL (ref 30–89)

## 2012-12-11 LAB — HIV ANTIBODY (ROUTINE TESTING W REFLEX): HIV: NONREACTIVE

## 2012-12-11 LAB — CULTURE, OB URINE
Colony Count: NO GROWTH
Organism ID, Bacteria: NO GROWTH

## 2012-12-12 LAB — PAP IG, CT-NG, RFX HPV ASCU: GC Probe Amp: NEGATIVE

## 2012-12-12 LAB — HEMOGLOBINOPATHY EVALUATION: Hgb F Quant: 0 % (ref 0.0–2.0)

## 2012-12-18 ENCOUNTER — Encounter: Payer: Self-pay | Admitting: Obstetrics & Gynecology

## 2013-01-07 ENCOUNTER — Ambulatory Visit (INDEPENDENT_AMBULATORY_CARE_PROVIDER_SITE_OTHER): Admitting: Obstetrics & Gynecology

## 2013-01-07 ENCOUNTER — Encounter: Payer: Self-pay | Admitting: Obstetrics & Gynecology

## 2013-01-07 VITALS — BP 107/73 | Temp 97.7°F | Wt 125.0 lb

## 2013-01-07 DIAGNOSIS — Z3481 Encounter for supervision of other normal pregnancy, first trimester: Secondary | ICD-10-CM

## 2013-01-07 DIAGNOSIS — Z3480 Encounter for supervision of other normal pregnancy, unspecified trimester: Secondary | ICD-10-CM

## 2013-01-07 LAB — POCT URINALYSIS DIPSTICK
Bilirubin, UA: NEGATIVE
Leukocytes, UA: NEGATIVE
Nitrite, UA: NEGATIVE
pH, UA: 5

## 2013-01-07 NOTE — Progress Notes (Signed)
Pulse- 101 

## 2013-02-05 ENCOUNTER — Encounter: Payer: Self-pay | Admitting: Obstetrics & Gynecology

## 2013-02-05 ENCOUNTER — Ambulatory Visit (INDEPENDENT_AMBULATORY_CARE_PROVIDER_SITE_OTHER): Admitting: Obstetrics & Gynecology

## 2013-02-05 VITALS — BP 104/68 | Temp 97.9°F | Wt 128.2 lb

## 2013-02-05 DIAGNOSIS — Z3480 Encounter for supervision of other normal pregnancy, unspecified trimester: Secondary | ICD-10-CM

## 2013-02-05 DIAGNOSIS — Z3482 Encounter for supervision of other normal pregnancy, second trimester: Secondary | ICD-10-CM

## 2013-02-05 LAB — POCT URINALYSIS DIPSTICK
Glucose, UA: NEGATIVE
Nitrite, UA: NEGATIVE
Urobilinogen, UA: NEGATIVE
pH, UA: 5

## 2013-02-05 NOTE — Progress Notes (Signed)
Pulse: 92

## 2013-02-05 NOTE — Patient Instructions (Addendum)
Pregnancy - Second Trimester The second trimester is the period between 13 to 27 weeks of your pregnancy. It is important to follow your doctor's instructions. HOME CARE   Do not smoke.  Do not drink alcohol or use drugs.  Only take medicine as told by your doctor.  Take prenatal vitamins as told. The vitamin should contain 1 milligram of folic acid.  Exercise.  Eat healthy foods. Eat regular, well-balanced meals.  You can have sex (intercourse) if there are no other problems with the pregnancy.  Do not use hot tubs, steam rooms, or saunas.  Wear a seat belt while driving.  Avoid raw meat, uncooked cheese, and litter boxes and soil used by cats.  Visit your dentist. Cleanings are okay. GET HELP RIGHT AWAY IF:   You have a temperature by mouth above 102 F (38.9 C), not controlled by medicine.  Fluid is coming from your vagina.  Blood is coming from your vagina. Light spotting is common, especially after sex (intercourse).  You have a bad smelling fluid (discharge) coming from the vagina. The fluid changes from clear to white.  You still feel sick to your stomach (nauseous).  You throw up (vomit) blood.  You lose or gain more than 2 pounds (0.9 kilograms) of weight in a week, or as suggested by your doctor.  Your face, hands, feet, or legs get puffy (swell).  You get exposed to German measles and have never had them.  You get exposed to fifth disease or chickenpox.  You have belly (abdominal) pain.  You have a bad headache that will not go away.  You have watery poop (diarrhea), pain when you pee (urinate), or have shortness of breath.  You start to have problems seeing (blurry or double vision).  You fall, are in a car accident, or have any kind of trauma.  There is mental or physical violence at home.  You have any concerns or worries during your pregnancy. MAKE SURE YOU:   Understand these instructions.  Will watch your condition.  Will get help  right away if you are not doing well or get worse. Document Released: 10/31/2009 Document Revised: 10/29/2011 Document Reviewed: 10/31/2009 [employer] Patient Information 2014 [employer].  

## 2013-02-10 NOTE — Progress Notes (Signed)
Doing well 

## 2013-02-22 ENCOUNTER — Other Ambulatory Visit: Payer: Self-pay | Admitting: *Deleted

## 2013-02-22 DIAGNOSIS — O0990 Supervision of high risk pregnancy, unspecified, unspecified trimester: Secondary | ICD-10-CM

## 2013-02-25 ENCOUNTER — Other Ambulatory Visit

## 2013-02-27 ENCOUNTER — Ambulatory Visit (HOSPITAL_COMMUNITY)
Admission: RE | Admit: 2013-02-27 | Discharge: 2013-02-27 | Disposition: A | Discharge status: Home / Self Care | Source: Ambulatory Visit | Attending: Obstetrics & Gynecology | Admitting: Obstetrics & Gynecology

## 2013-02-27 DIAGNOSIS — O0990 Supervision of high risk pregnancy, unspecified, unspecified trimester: Secondary | ICD-10-CM

## 2013-02-27 DIAGNOSIS — Z3689 Encounter for other specified antenatal screening: Secondary | ICD-10-CM | POA: Insufficient documentation

## 2013-03-09 ENCOUNTER — Ambulatory Visit (INDEPENDENT_AMBULATORY_CARE_PROVIDER_SITE_OTHER): Admitting: Obstetrics & Gynecology

## 2013-03-09 VITALS — BP 102/65 | Temp 98.0°F | Wt 135.0 lb

## 2013-03-09 DIAGNOSIS — Z3482 Encounter for supervision of other normal pregnancy, second trimester: Secondary | ICD-10-CM

## 2013-03-09 DIAGNOSIS — Z3480 Encounter for supervision of other normal pregnancy, unspecified trimester: Secondary | ICD-10-CM

## 2013-03-09 LAB — POCT URINALYSIS DIPSTICK
Blood, UA: NEGATIVE
Protein, UA: NEGATIVE
Spec Grav, UA: 1.01
Urobilinogen, UA: NEGATIVE

## 2013-03-09 NOTE — Progress Notes (Signed)
P- 97 Pt states she is having 1-2 episodes of tightening every once in a while.

## 2013-03-09 NOTE — Patient Instructions (Addendum)
Glucose Tolerance Test This is a test to see how your body processes carbohydrates. This test is often done to check patients for diabetes or the possibility of developing it. PREPARATION FOR TEST You should have nothing to eat or drink 12 hours before the test. You will be given a form of sugar (glucose) and then blood samples will be drawn from your vein to determine the level of sugar in your blood. Alternatively, blood may be drawn from your finger for testing. You should not smoke or exercise during the test. NORMAL FINDINGS  Fasting: 70-115 mg/dL  30 minutes: less than 200 mg/dL  1 hour: less than 200 mg/dL  2 hours: less than 140 mg/dL  3 hours: 70-115 mg/dL  4 hours: 70-115 mg/dL Ranges for normal findings may vary among different laboratories and hospitals. You should always check with your doctor after having lab work or other tests done to discuss the meaning of your test results and whether your values are considered within normal limits. MEANING OF TEST Your caregiver will go over the test results with you and discuss the importance and meaning of your results, as well as treatment options and the need for additional tests. OBTAINING THE TEST RESULTS It is your responsibility to obtain your test results. Ask the lab or department performing the test when and how you will get your results. Document Released: 08/29/2004 Document Revised: 10/29/2011 Document Reviewed: 07/17/2008 [employer] Patient Information 2014 [employer]. Tetanus, Diphtheria, Pertussis (Tdap) Vaccine What You Need to Know WHY GET VACCINATED? Tetanus, diphtheria and pertussis can be very serious diseases, even for adolescents and adults. Tdap vaccine can protect us from these diseases. TETANUS (Lockjaw) causes painful muscle tightening and stiffness, usually all over the body.  It can lead to tightening of muscles in the head and neck so you can't open your mouth, swallow, or sometimes even breathe.  Tetanus kills about 1 out of 5 people who are infected. DIPHTHERIA can cause a thick coating to form in the back of the throat.  It can lead to breathing problems, paralysis, heart failure, and death. PERTUSSIS (Whooping Cough) causes severe coughing spells, which can cause difficulty breathing, vomiting and disturbed sleep.  It can also lead to weight loss, incontinence, and rib fractures. Up to 2 in 100 adolescents and 5 in 100 adults with pertussis are hospitalized or have complications, which could include pneumonia and death. These diseases are caused by bacteria. Diphtheria and pertussis are spread from person to person through coughing or sneezing. Tetanus enters the body through cuts, scratches, or wounds. Before vaccines, the [place] saw as many as 200,000 cases a year of diphtheria and pertussis, and hundreds of cases of tetanus. Since vaccination began, tetanus and diphtheria have dropped by about 99% and pertussis by about 80%. TDAP VACCINE Tdap vaccine can protect adolescents and adults from tetanus, diphtheria, and pertussis. One dose of Tdap is routinely given at age 11 or 12. People who did not get Tdap at that age should get it as soon as possible. Tdap is especially important for health care professionals and anyone having close contact with a baby younger than 12 months. Pregnant women should get a dose of Tdap during every pregnancy, to protect the newborn from pertussis. Infants are most at risk for severe, life-threatening complications from pertussis. A similar vaccine, called Td, protects from tetanus and diphtheria, but not pertussis. A Td booster should be given every 10 years. Tdap may be given as one of these boosters   if you have not already gotten a dose. Tdap may also be given after a severe cut or burn to prevent tetanus infection. Your doctor can give you more information. Tdap may safely be given at the same time as other vaccines. SOME PEOPLE SHOULD NOT GET  THIS VACCINE  If you ever had a life-threatening allergic reaction after a dose of any tetanus, diphtheria, or pertussis containing vaccine, OR if you have a severe allergy to any part of this vaccine, you should not get Tdap. Tell your doctor if you have any severe allergies.  If you had a coma, or long or multiple seizures within 7 days after a childhood dose of DTP or DTaP, you should not get Tdap, unless a cause other than the vaccine was found. You can still get Td.  Talk to your doctor if you:  have epilepsy or another nervous system problem,  had severe pain or swelling after any vaccine containing diphtheria, tetanus or pertussis,  ever had Guillain-Barr Syndrome (GBS),  aren't feeling well on the day the shot is scheduled. RISKS OF A VACCINE REACTION With any medicine, including vaccines, there is a chance of side effects. These are usually mild and go away on their own, but serious reactions are also possible. Brief fainting spells can follow a vaccination, leading to injuries from falling. Sitting or lying down for about 15 minutes can help prevent these. Tell your doctor if you feel dizzy or light-headed, or have vision changes or ringing in the ears. Mild problems following Tdap (Did not interfere with activities)  Pain where the shot was given (about 3 in 4 adolescents or 2 in 3 adults)  Redness or swelling where the shot was given (about 1 person in 5)  Mild fever of at least 100.4F (up to about 1 in 25 adolescents or 1 in 100 adults)  Headache (about 3 or 4 people in 10)  Tiredness (about 1 person in 3 or 4)  Nausea, vomiting, diarrhea, stomach ache (up to 1 in 4 adolescents or 1 in 10 adults)  Chills, body aches, sore joints, rash, swollen glands (uncommon) Moderate problems following Tdap (Interfered with activities, but did not require medical attention)  Pain where the shot was given (about 1 in 5 adolescents or 1 in 100 adults)  Redness or swelling where  the shot was given (up to about 1 in 16 adolescents or 1 in 25 adults)  Fever over 102F (about 1 in 100 adolescents or 1 in 250 adults)  Headache (about 3 in 20 adolescents or 1 in 10 adults)  Nausea, vomiting, diarrhea, stomach ache (up to 1 or 3 people in 100)  Swelling of the entire arm where the shot was given (up to about 3 in 100). Severe problems following Tdap (Unable to perform usual activities, required medical attention)  Swelling, severe pain, bleeding and redness in the arm where the shot was given (rare). A severe allergic reaction could occur after any vaccine (estimated less than 1 in a million doses). WHAT IF THERE IS A SERIOUS REACTION? What should I look for?  Look for anything that concerns you, such as signs of a severe allergic reaction, very high fever, or behavior changes. Signs of a severe allergic reaction can include hives, swelling of the face and throat, difficulty breathing, a fast heartbeat, dizziness, and weakness. These would start a few minutes to a few hours after the vaccination. What should I do?  If you think it is a severe allergic reaction   or other emergency that can't wait, call 9-1-1 or get the person to the nearest hospital. Otherwise, call your doctor.  Afterward, the reaction should be reported to the "Vaccine Adverse Event Reporting System" (VAERS). Your doctor might file this report, or you can do it yourself through the VAERS web site at www.vaers.hhs.gov, or by calling 1-800-822-7967. VAERS is only for reporting reactions. They do not give medical advice.  THE NATIONAL VACCINE INJURY COMPENSATION PROGRAM The National Vaccine Injury Compensation Program (VICP) is a federal program that was created to compensate people who may have been injured by certain vaccines. Persons who believe they may have been injured by a vaccine can learn about the program and about filing a claim by calling 1-800-338-2382 or visiting the VICP website at  www.hrsa.gov/vaccinecompensation. HOW CAN I LEARN MORE?  Ask your doctor.  Call your local or state health department.  Contact the Centers for Disease Control and Prevention (CDC):  Call 1-800-232-4636 or visit CDC's website at www.cdc.gov/vaccines. CDC Tdap Vaccine VIS (12/27/11) Document Released: 02/05/2012 Document Revised: 04/30/2012 Document Reviewed: 02/05/2012 [employer] Patient Information 2014 [employer].  

## 2013-03-10 ENCOUNTER — Encounter: Payer: Self-pay | Admitting: Obstetrics & Gynecology

## 2013-03-10 NOTE — Progress Notes (Signed)
Doing well 

## 2013-04-01 ENCOUNTER — Encounter: Admitting: Obstetrics & Gynecology

## 2013-04-01 ENCOUNTER — Other Ambulatory Visit

## 2013-04-07 ENCOUNTER — Ambulatory Visit (INDEPENDENT_AMBULATORY_CARE_PROVIDER_SITE_OTHER): Admitting: Obstetrics

## 2013-04-07 ENCOUNTER — Encounter: Payer: Self-pay | Admitting: Obstetrics

## 2013-04-07 VITALS — BP 102/74 | Temp 97.1°F | Wt 139.0 lb

## 2013-04-07 DIAGNOSIS — Z3482 Encounter for supervision of other normal pregnancy, second trimester: Secondary | ICD-10-CM

## 2013-04-07 DIAGNOSIS — Z3480 Encounter for supervision of other normal pregnancy, unspecified trimester: Secondary | ICD-10-CM

## 2013-04-07 LAB — POCT URINALYSIS DIPSTICK
Ketones, UA: NEGATIVE
Leukocytes, UA: NEGATIVE
Spec Grav, UA: 1.02
Urobilinogen, UA: NEGATIVE
pH, UA: 6

## 2013-04-07 NOTE — Progress Notes (Signed)
Pulse 103, patient state she has no concerns

## 2013-04-28 ENCOUNTER — Other Ambulatory Visit

## 2013-04-28 ENCOUNTER — Encounter: Payer: Self-pay | Admitting: Obstetrics

## 2013-04-28 ENCOUNTER — Ambulatory Visit (INDEPENDENT_AMBULATORY_CARE_PROVIDER_SITE_OTHER): Admitting: Obstetrics

## 2013-04-28 VITALS — BP 113/77 | Temp 97.7°F | Wt 141.6 lb

## 2013-04-28 DIAGNOSIS — Z3483 Encounter for supervision of other normal pregnancy, third trimester: Secondary | ICD-10-CM

## 2013-04-28 DIAGNOSIS — Z3480 Encounter for supervision of other normal pregnancy, unspecified trimester: Secondary | ICD-10-CM

## 2013-04-28 LAB — POCT URINALYSIS DIPSTICK
Bilirubin, UA: NEGATIVE
Blood, UA: NEGATIVE
Ketones, UA: NEGATIVE
pH, UA: 7

## 2013-04-28 NOTE — Progress Notes (Signed)
Pulse 90, pt c/o increase in braxton hicks contractions

## 2013-04-28 NOTE — Progress Notes (Signed)
PTL precautions given

## 2013-05-14 ENCOUNTER — Ambulatory Visit (INDEPENDENT_AMBULATORY_CARE_PROVIDER_SITE_OTHER): Admitting: Obstetrics & Gynecology

## 2013-05-14 VITALS — BP 111/69 | Temp 97.8°F | Wt 143.6 lb

## 2013-05-14 DIAGNOSIS — Z23 Encounter for immunization: Secondary | ICD-10-CM

## 2013-05-14 DIAGNOSIS — Z3482 Encounter for supervision of other normal pregnancy, second trimester: Secondary | ICD-10-CM

## 2013-05-14 DIAGNOSIS — Z3480 Encounter for supervision of other normal pregnancy, unspecified trimester: Secondary | ICD-10-CM

## 2013-05-14 LAB — POCT URINALYSIS DIPSTICK
Blood, UA: NEGATIVE
Glucose, UA: NEGATIVE
Ketones, UA: NEGATIVE
Spec Grav, UA: 1.02

## 2013-05-14 NOTE — Progress Notes (Signed)
Pulse- 94. CBG - 87. Doing well.

## 2013-05-15 ENCOUNTER — Encounter: Payer: Self-pay | Admitting: Obstetrics & Gynecology

## 2013-05-15 NOTE — Patient Instructions (Signed)

## 2013-05-27 ENCOUNTER — Encounter: Payer: Self-pay | Admitting: Obstetrics & Gynecology

## 2013-05-28 ENCOUNTER — Ambulatory Visit (INDEPENDENT_AMBULATORY_CARE_PROVIDER_SITE_OTHER): Admitting: Obstetrics & Gynecology

## 2013-05-28 VITALS — BP 118/78 | Temp 98.0°F | Wt 148.0 lb

## 2013-05-28 DIAGNOSIS — Z3480 Encounter for supervision of other normal pregnancy, unspecified trimester: Secondary | ICD-10-CM

## 2013-05-28 DIAGNOSIS — Z3483 Encounter for supervision of other normal pregnancy, third trimester: Secondary | ICD-10-CM

## 2013-05-28 LAB — POCT URINALYSIS DIPSTICK
Bilirubin, UA: NEGATIVE
Glucose, UA: NEGATIVE
Ketones, UA: NEGATIVE
Leukocytes, UA: NEGATIVE
Protein, UA: NEGATIVE
Spec Grav, UA: 1.02

## 2013-05-28 NOTE — Progress Notes (Signed)
P 80 Patient reports she had some mild contraction over the weekend. Patient has some pain and pressure that seems normal at this time.

## 2013-05-29 ENCOUNTER — Encounter: Payer: Self-pay | Admitting: Obstetrics & Gynecology

## 2013-06-11 ENCOUNTER — Ambulatory Visit (INDEPENDENT_AMBULATORY_CARE_PROVIDER_SITE_OTHER): Admitting: Obstetrics & Gynecology

## 2013-06-11 ENCOUNTER — Encounter: Payer: Self-pay | Admitting: Obstetrics & Gynecology

## 2013-06-11 VITALS — BP 113/74 | Temp 98.1°F | Wt 150.0 lb

## 2013-06-11 DIAGNOSIS — Z3483 Encounter for supervision of other normal pregnancy, third trimester: Secondary | ICD-10-CM

## 2013-06-11 DIAGNOSIS — Z3480 Encounter for supervision of other normal pregnancy, unspecified trimester: Secondary | ICD-10-CM

## 2013-06-11 LAB — POCT URINALYSIS DIPSTICK
Bilirubin, UA: NEGATIVE
Glucose, UA: NEGATIVE
Ketones, UA: NEGATIVE
Nitrite, UA: NEGATIVE
pH, UA: 7

## 2013-06-11 NOTE — Patient Instructions (Signed)
Contraception Choices  Contraception (birth control) is the use of any methods or devices to prevent pregnancy. Below are some methods to help avoid pregnancy.  HORMONAL METHODS   · Contraceptive implant. This is a thin, plastic tube containing progesterone hormone. It does not contain estrogen hormone. Your caregiver inserts the tube in the inner part of the upper arm. The tube can remain in place for up to 3 years. After 3 years, the implant must be removed. The implant prevents the ovaries from releasing an egg (ovulation), thickens the cervical mucus which prevents sperm from entering the uterus, and thins the lining of the inside of the uterus.  · Progesterone-only injections. These injections are given every 3 months by your caregiver to prevent pregnancy. This synthetic progesterone hormone stops the ovaries from releasing eggs. It also thickens cervical mucus and changes the uterine lining. This makes it harder for sperm to survive in the uterus.  · Birth control pills. These pills contain estrogen and progesterone hormone. They work by stopping the egg from forming in the ovary (ovulation). Birth control pills are prescribed by a caregiver. Birth control pills can also be used to treat heavy periods.  · Minipill. This type of birth control pill contains only the progesterone hormone. They are taken every day of each month and must be prescribed by your caregiver.  · Birth control patch. The patch contains hormones similar to those in birth control pills. It must be changed once a week and is prescribed by a caregiver.  · Vaginal ring. The ring contains hormones similar to those in birth control pills. It is left in the vagina for 3 weeks, removed for 1 week, and then a new one is put back in place. The patient must be comfortable inserting and removing the ring from the vagina. A caregiver's prescription is necessary.  · Emergency contraception. Emergency contraceptives prevent pregnancy after unprotected  sexual intercourse. This pill can be taken right after sex or up to 5 days after unprotected sex. It is most effective the sooner you take the pills after having sexual intercourse. Emergency contraceptive pills are available without a prescription. Check with your pharmacist. Do not use emergency contraception as your only form of birth control.  BARRIER METHODS   · Female condom. This is a thin sheath (latex or rubber) that is worn over the penis during sexual intercourse. It can be used with spermicide to increase effectiveness.  · Female condom. This is a soft, loose-fitting sheath that is put into the vagina before sexual intercourse.  · Diaphragm. This is a soft, latex, dome-shaped barrier that must be fitted by a caregiver. It is inserted into the vagina, along with a spermicidal jelly. It is inserted before intercourse. The diaphragm should be left in the vagina for 6 to 8 hours after intercourse.  · Cervical cap. This is a round, soft, latex or plastic cup that fits over the cervix and must be fitted by a caregiver. The cap can be left in place for up to 48 hours after intercourse.  · Sponge. This is a soft, circular piece of polyurethane foam. The sponge has spermicide in it. It is inserted into the vagina after wetting it and before sexual intercourse.  · Spermicides. These are chemicals that kill or block sperm from entering the cervix and uterus. They come in the form of creams, jellies, suppositories, foam, or tablets. They do not require a prescription. They are inserted into the vagina with an applicator before having sexual intercourse.   The process must be repeated every time you have sexual intercourse.  INTRAUTERINE CONTRACEPTION  · Intrauterine device (IUD). This is a T-shaped device that is put in a woman's uterus during a menstrual period to prevent pregnancy. There are 2 types:  · Copper IUD. This type of IUD is wrapped in copper wire and is placed inside the uterus. Copper makes the uterus and  fallopian tubes produce a fluid that kills sperm. It can stay in place for 10 years.  · Hormone IUD. This type of IUD contains the hormone progestin (synthetic progesterone). The hormone thickens the cervical mucus and prevents sperm from entering the uterus, and it also thins the uterine lining to prevent implantation of a fertilized egg. The hormone can weaken or kill the sperm that get into the uterus. It can stay in place for 5 years.  PERMANENT METHODS OF CONTRACEPTION  · Female tubal ligation. This is when the woman's fallopian tubes are surgically sealed, tied, or blocked to prevent the egg from traveling to the uterus.  · Female sterilization. This is when the female has the tubes that carry sperm tied off (vasectomy). This blocks sperm from entering the vagina during sexual intercourse. After the procedure, the man can still ejaculate fluid (semen).  NATURAL PLANNING METHODS  · Natural family planning. This is not having sexual intercourse or using a barrier method (condom, diaphragm, cervical cap) on days the woman could become pregnant.  · Calendar method. This is keeping track of the length of each menstrual cycle and identifying when you are fertile.  · Ovulation method. This is avoiding sexual intercourse during ovulation.  · Symptothermal method. This is avoiding sexual intercourse during ovulation, using a thermometer and ovulation symptoms.  · Post-ovulation method. This is timing sexual intercourse after you have ovulated.  Regardless of which type or method of contraception you choose, it is important that you use condoms to protect against the transmission of sexually transmitted diseases (STDs). Talk with your caregiver about which form of contraception is most appropriate for you.  Document Released: 08/06/2005 Document Revised: 10/29/2011 Document Reviewed: 12/13/2010  [employer]® Patient Information ©2014 [employer].

## 2013-06-11 NOTE — Progress Notes (Signed)
P 93   Patient states she has been having irregular contractions that can happen at any time. Patient states she does have a history of preterm symptom previously and would like to be checked- just to make sure she is not having changes.  Plans condoms.

## 2013-06-12 ENCOUNTER — Inpatient Hospital Stay (HOSPITAL_COMMUNITY)
Admission: AD | Admit: 2013-06-12 | Discharge: 2013-06-12 | Disposition: A | Discharge status: Home / Self Care | Source: Ambulatory Visit | Attending: Obstetrics | Admitting: Obstetrics

## 2013-06-12 ENCOUNTER — Encounter (HOSPITAL_COMMUNITY): Payer: Self-pay | Admitting: General Practice

## 2013-06-12 DIAGNOSIS — N949 Unspecified condition associated with female genital organs and menstrual cycle: Secondary | ICD-10-CM | POA: Insufficient documentation

## 2013-06-12 DIAGNOSIS — O479 False labor, unspecified: Secondary | ICD-10-CM

## 2013-06-12 DIAGNOSIS — O4700 False labor before 37 completed weeks of gestation, unspecified trimester: Secondary | ICD-10-CM | POA: Insufficient documentation

## 2013-06-12 LAB — URINALYSIS, ROUTINE W REFLEX MICROSCOPIC
Bilirubin Urine: NEGATIVE
Nitrite: NEGATIVE
Specific Gravity, Urine: 1.02 (ref 1.005–1.030)
Urobilinogen, UA: 0.2 mg/dL (ref 0.0–1.0)

## 2013-06-12 LAB — URINE MICROSCOPIC-ADD ON

## 2013-06-12 NOTE — MAU Note (Signed)
Patient states she had a sudden leaking of clear liquid at 1110 that went through pants and panties. Has had no more leaking since that time.  Has some mild irregular contractions and pressure. Reports good fetal movement. Was 1 cm with a shortnening cervix yesterday.

## 2013-06-12 NOTE — MAU Provider Note (Signed)
Chief Complaint:  Vaginal Discharge   First Provider Initiated Contact with Patient 06/12/13 1459      HPI: Karla Bauer is a 27 y.o. G2P1001 at 60w0dwho presents to maternity admissions reporting possible leakage of clear fluid.  She was opening a water bottle and then noticed clear fluid soaking her pants and underwear.  She is not sure if the bottle leaked or if her water broke.  She reports good fetal movement, denies regular contractions, vaginal bleeding, vaginal itching/burning, urinary symptoms, h/a, dizziness, n/v, or fever/chills.    Past Medical History: Past Medical History  Diagnosis Date  . No pertinent past medical history   . H/o Lyme disease     Past obstetric history: OB History  Gravida Para Term Preterm AB SAB TAB Ectopic Multiple Living  2 1 1       1     # Outcome Date GA Lbr Len/2nd Weight Sex Delivery Anes PTL Lv  2 CUR           1 TRM 11/24/11 [redacted]w[redacted]d 23:30 / 01:38 3.24 kg (7 lb 2.3 oz) F SVD EPI  Y     Comments: wnl      Past Surgical History: Past Surgical History  Procedure Laterality Date  . Plastic surgery on face   2001  . Wisdom tooth extraction      Family History: Family History  Problem Relation Age of Onset  . Colon cancer      maternal and paternal  . Skin cancer    . Heart attack Paternal Grandfather   . Depression    . Diabetes Paternal Grandmother   . Hyperlipidemia      Social History: History  Substance Use Topics  . Smoking status: Never Smoker   . Smokeless tobacco: Never Used  . Alcohol Use: No    Allergies: No Known Allergies  Meds:  Prescriptions prior to admission  Medication Sig Dispense Refill  . acetaminophen (TYLENOL) 500 MG tablet Take 500 mg by mouth every 6 (six) hours as needed for pain.      . Prenatal Vit-Fe Fumarate-FA (PRENATAL MULTIVITAMIN) TABS Take 1 tablet by mouth at bedtime.         ROS: Pertinent findings in history of present illness.  Physical Exam  Blood pressure 113/81, pulse 97,  temperature 98.5 F (36.9 C), temperature source Oral, resp. rate 16, height 5\' 5"  (1.651 m), weight 68.221 kg (150 lb 6.4 oz), last menstrual period 10/17/2012, SpO2 99.00%. GENERAL: Well-developed, well-nourished female in no acute distress.  HEENT: normocephalic HEART: normal rate RESP: normal effort ABDOMEN: Soft, non-tender, gravid appropriate for gestational age EXTREMITIES: Nontender, no edema NEURO: alert and oriented SPECULUM EXAM: pooling negative with cough/bearing down, cervix visually closed    Ferning negative  FHT:  Baseline 135, moderate variability, accelerations present, no decelerations Contractions: none on toco or to palpation   Labs: Results for orders placed during the hospital encounter of 06/12/13 (from the past 24 hour(s))  URINALYSIS, ROUTINE W REFLEX MICROSCOPIC     Status: Abnormal   Collection Time    06/12/13  1:40 PM      Result Value Range   Color, Urine YELLOW  YELLOW   APPearance HAZY (*) CLEAR   Specific Gravity, Urine 1.020  1.005 - 1.030   pH 6.0  5.0 - 8.0   Glucose, UA NEGATIVE  NEGATIVE mg/dL   Hgb urine dipstick SMALL (*) NEGATIVE   Bilirubin Urine NEGATIVE  NEGATIVE   Ketones, ur NEGATIVE  NEGATIVE mg/dL   Protein, ur NEGATIVE  NEGATIVE mg/dL   Urobilinogen, UA 0.2  0.0 - 1.0 mg/dL   Nitrite NEGATIVE  NEGATIVE   Leukocytes, UA SMALL (*) NEGATIVE  URINE MICROSCOPIC-ADD ON     Status: Abnormal   Collection Time    06/12/13  1:40 PM      Result Value Range   Squamous Epithelial / LPF MANY (*) RARE   WBC, UA 7-10  <3 WBC/hpf   Bacteria, UA FEW (*) RARE    Assessment: 1. Threatened preterm labor, antepartum, third trimester   2.  Intact membranes  Plan: Discharge home Labor precautions and fetal kick counts F/U in office as scheduled Return to MAU as needed    Medication List    ASK your doctor about these medications       acetaminophen 500 MG tablet  Commonly known as:  TYLENOL  Take 500 mg by mouth every 6 (six)  hours as needed for pain.     prenatal multivitamin Tabs tablet  Take 1 tablet by mouth at bedtime.        Sharen Counter Certified Nurse-Midwife 06/12/2013 3:09 PM

## 2013-06-14 LAB — URINE CULTURE: Colony Count: 55000

## 2013-06-25 ENCOUNTER — Ambulatory Visit (INDEPENDENT_AMBULATORY_CARE_PROVIDER_SITE_OTHER): Admitting: Obstetrics & Gynecology

## 2013-06-25 VITALS — BP 117/87 | Temp 97.4°F | Wt 151.8 lb

## 2013-06-25 DIAGNOSIS — Z3480 Encounter for supervision of other normal pregnancy, unspecified trimester: Secondary | ICD-10-CM

## 2013-06-25 DIAGNOSIS — Z3483 Encounter for supervision of other normal pregnancy, third trimester: Secondary | ICD-10-CM

## 2013-06-25 LAB — POCT URINALYSIS DIPSTICK
Bilirubin, UA: NEGATIVE
Glucose, UA: NEGATIVE
Ketones, UA: NEGATIVE
Urobilinogen, UA: NEGATIVE
pH, UA: 7

## 2013-06-25 NOTE — Patient Instructions (Signed)
Patient information: Group B streptococcus and pregnancy (Beyond the Basics)  Authors Karen M Puopolo, MD, PhD Carol J Baker, MD Section Editors Charles J Lockwood, MD Daniel J Sexton, MD Deputy Editor Vanessa A Barss, MD Disclosures  All topics are updated as new evidence becomes available and our peer review process is complete.  Literature review current through: Feb 2014.  This topic last updated: Feb 18, 2012.  INTRODUCTION - Group B streptococcus (GBS) is a bacterium that can cause serious infections in pregnant women and newborn babies. GBS is one of many types of streptococcal bacteria, sometimes called "strep." This article discusses GBS, its effect on pregnant women and infants, and ways to prevent complications of GBS. More detailed information about GBS is available by subscription. (See "Group B streptococcal infection in pregnant women".) WHAT IS GROUP B STREP INFECTION? - GBS is commonly found in the digestive system and the vagina. In healthy adults, GBS is not harmful and does not cause problems. But in pregnant women and newborn infants, being infected with GBS can cause serious illness. Approximately one in three to four pregnant women in the [place] carries GBS in their gastrointestinal system and/or in their vagina. Carrying GBS is not the same as being infected. Carriers are not sick and do not need treatment during pregnancy. There is no treatment that can stop you from carrying GBS.  Pregnant women who are carriers of GBS infrequently become infected with GBS. GBS can cause urinary tract infections, infection of the amniotic fluid (bag of water), and infection of the uterus after delivery. GBS infections during pregnancy may lead to preterm labor.  Pregnant women who carry GBS can pass on the bacteria to their newborns, and some of those babies become infected with GBS. Newborns who are infected with GBS can develop pneumonia (lung infection), septicemia (blood infection), or  meningitis (infection of the lining of the brain and spinal cord). These complications can be prevented by giving intravenous antibiotics during labor to any woman who is at risk of GBS infection. You are at risk of GBS infection if: You have a urine culture during your current pregnancy showing GBS  You have a vaginal and rectal culture during your current pregnancy showing GBS  You had an infant infected with GBS in the past GROUP B STREP PREVENTION - Most doctors and nurses recommend a urine culture early in your pregnancy to be sure that you do not have a bladder infection without symptoms. If you urine culture shows GBS or other bacteria, you may be treated with an antibiotic. If you have symptoms of urinary infection, such as pain with urination, any time during your pregnancy, a urine culture is done. If GBS grows from the urine culture, it should be treated with an antibiotic, and you should also receive intravenous antibiotics during labor. Expert groups recommend that all pregnant women have a GBS culture at 35 to 37 weeks of pregnancy. The culture is done by swabbing the vagina and rectum. If your GBS culture is positive, you will be given an intravenous antibiotic during labor. If you have preterm labor, the culture is done then and an intravenous antibiotic is given until the baby is born or the labor is stopped by your health care provider. If you have a positive GBS culture and you have an allergy to penicillin, be sure your doctor and nurse are aware of this allergy and tell them what happened with the allergy. If you had only a rash or itching, this   is not a serious allergy, and you can receive a common drug related to the penicillin. If you had a serious allergy (for example, trouble breathing, swelling of your face) you may need an additional test to determine which antibiotic should be used during labor. Being treated with an antibiotic during labor greatly reduces the chance that you or  your newborn will develop infections related to GBS. It is important to note that young infants up to age 3 months can also develop septicemia, meningitis and other serious infections from GBS. Being treated with an antibiotic during labor does not reduce the chance that your baby will develop this later type of infection. There is currently no known way of preventing this later-onset GBS disease. WHERE TO GET MORE INFORMATION - Your healthcare provider is the best source of information for questions and concerns related to your medical problem.  

## 2013-06-25 NOTE — Progress Notes (Signed)
Pulse: 91

## 2013-06-25 NOTE — Addendum Note (Signed)
Addended by: George Hugh on: 06/25/2013 03:13 PM   Modules accepted: Orders

## 2013-06-27 LAB — STREP B DNA PROBE: GBSP: NEGATIVE

## 2013-07-02 ENCOUNTER — Ambulatory Visit (INDEPENDENT_AMBULATORY_CARE_PROVIDER_SITE_OTHER): Admitting: Obstetrics

## 2013-07-02 ENCOUNTER — Encounter: Payer: Self-pay | Admitting: Obstetrics

## 2013-07-02 VITALS — BP 111/74 | Temp 97.8°F | Wt 151.6 lb

## 2013-07-02 DIAGNOSIS — Z3483 Encounter for supervision of other normal pregnancy, third trimester: Secondary | ICD-10-CM

## 2013-07-02 DIAGNOSIS — O4700 False labor before 37 completed weeks of gestation, unspecified trimester: Secondary | ICD-10-CM

## 2013-07-02 DIAGNOSIS — Z3480 Encounter for supervision of other normal pregnancy, unspecified trimester: Secondary | ICD-10-CM

## 2013-07-02 LAB — POCT URINALYSIS DIPSTICK
Glucose, UA: NEGATIVE
Leukocytes, UA: NEGATIVE
Nitrite, UA: NEGATIVE
Spec Grav, UA: 1.01
Urobilinogen, UA: NEGATIVE

## 2013-07-02 NOTE — Progress Notes (Signed)
Pulse- 88.  Patient states increased pressure and irregular contractions last night - pt requests a cervix check.

## 2013-07-02 NOTE — Progress Notes (Signed)
NST:  Reactive.  Uterine irritability.

## 2013-07-09 ENCOUNTER — Encounter: Payer: Self-pay | Admitting: Obstetrics

## 2013-07-09 ENCOUNTER — Ambulatory Visit (INDEPENDENT_AMBULATORY_CARE_PROVIDER_SITE_OTHER): Admitting: Obstetrics

## 2013-07-09 VITALS — BP 122/81 | Temp 97.5°F | Wt 156.4 lb

## 2013-07-09 DIAGNOSIS — Z3483 Encounter for supervision of other normal pregnancy, third trimester: Secondary | ICD-10-CM

## 2013-07-09 DIAGNOSIS — Z3480 Encounter for supervision of other normal pregnancy, unspecified trimester: Secondary | ICD-10-CM

## 2013-07-09 LAB — POCT URINALYSIS DIPSTICK
Ketones, UA: NEGATIVE
Leukocytes, UA: NEGATIVE
Nitrite, UA: NEGATIVE
Spec Grav, UA: <=1.005
Urobilinogen, UA: NEGATIVE

## 2013-07-09 NOTE — Progress Notes (Signed)
Pulse: 88

## 2013-07-15 ENCOUNTER — Ambulatory Visit (INDEPENDENT_AMBULATORY_CARE_PROVIDER_SITE_OTHER): Admitting: Obstetrics & Gynecology

## 2013-07-15 VITALS — BP 109/64 | Temp 97.7°F | Wt 156.4 lb

## 2013-07-15 DIAGNOSIS — Z3480 Encounter for supervision of other normal pregnancy, unspecified trimester: Secondary | ICD-10-CM

## 2013-07-15 DIAGNOSIS — Z3483 Encounter for supervision of other normal pregnancy, third trimester: Secondary | ICD-10-CM

## 2013-07-15 LAB — POCT URINALYSIS DIPSTICK
Bilirubin, UA: NEGATIVE
Nitrite, UA: NEGATIVE
Protein, UA: NEGATIVE
Urobilinogen, UA: NEGATIVE
pH, UA: 7

## 2013-07-15 NOTE — Progress Notes (Signed)
Pulse- 57 Doing well.

## 2013-07-16 ENCOUNTER — Encounter: Payer: Self-pay | Admitting: Obstetrics & Gynecology

## 2013-07-18 ENCOUNTER — Encounter (HOSPITAL_COMMUNITY): Admitting: Anesthesiology

## 2013-07-18 ENCOUNTER — Inpatient Hospital Stay (HOSPITAL_COMMUNITY): Admitting: Anesthesiology

## 2013-07-18 ENCOUNTER — Encounter (HOSPITAL_COMMUNITY): Payer: Self-pay | Admitting: *Deleted

## 2013-07-18 ENCOUNTER — Inpatient Hospital Stay (HOSPITAL_COMMUNITY)
Admission: AD | Admit: 2013-07-18 | Discharge: 2013-07-19 | DRG: 775 | Disposition: A | Discharge status: Home / Self Care | Source: Ambulatory Visit | Attending: Obstetrics | Admitting: Obstetrics

## 2013-07-18 DIAGNOSIS — IMO0001 Reserved for inherently not codable concepts without codable children: Secondary | ICD-10-CM

## 2013-07-18 HISTORY — DX: Lyme disease, unspecified: A69.20

## 2013-07-18 LAB — CBC
Hemoglobin: 13.6 g/dL (ref 12.0–15.0)
MCH: 30.4 pg (ref 26.0–34.0)
MCHC: 35 g/dL (ref 30.0–36.0)
RDW: 13.4 % (ref 11.5–15.5)
WBC: 13 10*3/uL — ABNORMAL HIGH (ref 4.0–10.5)

## 2013-07-18 MED ORDER — IBUPROFEN 600 MG PO TABS: 600.0000 mg | ORAL_TABLET | Freq: Four times a day (QID) | ORAL | Status: DC | PRN

## 2013-07-18 MED ORDER — OXYTOCIN 40 UNITS IN LACTATED RINGERS INFUSION - SIMPLE MED
62.5000 mL/h | INTRAVENOUS | Status: DC
  Administered 2013-07-18: 62.5 mL/h via INTRAVENOUS
  Filled 2013-07-18: qty 1000

## 2013-07-18 MED ORDER — ZOLPIDEM TARTRATE 5 MG PO TABS: 5.0000 mg | ORAL_TABLET | Freq: Every evening | ORAL | Status: DC | PRN

## 2013-07-18 MED ORDER — LIDOCAINE HCL (PF) 1 % IJ SOLN
30.0000 mL | INTRAMUSCULAR | Status: DC | PRN
  Filled 2013-07-18 (×2): qty 30

## 2013-07-18 MED ORDER — SIMETHICONE 80 MG PO CHEW: 80.0000 mg | CHEWABLE_TABLET | ORAL | Status: DC | PRN

## 2013-07-18 MED ORDER — ONDANSETRON HCL 4 MG/2ML IJ SOLN: 4.0000 mg | Freq: Four times a day (QID) | INTRAMUSCULAR | Status: DC | PRN

## 2013-07-18 MED ORDER — LACTATED RINGERS IV SOLN: 500.0000 mL | Freq: Once | INTRAVENOUS | Status: DC

## 2013-07-18 MED ORDER — PHENYLEPHRINE 40 MCG/ML (10ML) SYRINGE FOR IV PUSH (FOR BLOOD PRESSURE SUPPORT)
80.0000 ug | PREFILLED_SYRINGE | INTRAVENOUS | Status: DC | PRN
  Filled 2013-07-18: qty 10
  Filled 2013-07-18: qty 2

## 2013-07-18 MED ORDER — BENZOCAINE-MENTHOL 20-0.5 % EX AERO
1.0000 "application " | INHALATION_SPRAY | CUTANEOUS | Status: DC | PRN
  Administered 2013-07-18: 1 via TOPICAL
  Filled 2013-07-18: qty 56

## 2013-07-18 MED ORDER — DIBUCAINE 1 % RE OINT: 1.0000 "application " | TOPICAL_OINTMENT | RECTAL | Status: DC | PRN

## 2013-07-18 MED ORDER — LACTATED RINGERS IV SOLN
INTRAVENOUS | Status: DC
  Administered 2013-07-18: 18:00:00 via INTRAVENOUS

## 2013-07-18 MED ORDER — LIDOCAINE HCL (PF) 1 % IJ SOLN
INTRAMUSCULAR | Status: DC | PRN
  Administered 2013-07-18 (×2): 9 mL

## 2013-07-18 MED ORDER — PHENYLEPHRINE 40 MCG/ML (10ML) SYRINGE FOR IV PUSH (FOR BLOOD PRESSURE SUPPORT)
80.0000 ug | PREFILLED_SYRINGE | INTRAVENOUS | Status: DC | PRN
  Administered 2013-07-18: 40 ug via INTRAVENOUS
  Filled 2013-07-18: qty 2

## 2013-07-18 MED ORDER — ONDANSETRON HCL 4 MG PO TABS: 4.0000 mg | ORAL_TABLET | ORAL | Status: DC | PRN

## 2013-07-18 MED ORDER — OXYCODONE-ACETAMINOPHEN 5-325 MG PO TABS: 1.0000 | ORAL_TABLET | ORAL | Status: DC | PRN

## 2013-07-18 MED ORDER — DIPHENHYDRAMINE HCL 25 MG PO CAPS: 25.0000 mg | ORAL_CAPSULE | Freq: Four times a day (QID) | ORAL | Status: DC | PRN

## 2013-07-18 MED ORDER — ONDANSETRON HCL 4 MG/2ML IJ SOLN: 4.0000 mg | INTRAMUSCULAR | Status: DC | PRN

## 2013-07-18 MED ORDER — DIPHENHYDRAMINE HCL 50 MG/ML IJ SOLN: 12.5000 mg | INTRAMUSCULAR | Status: DC | PRN

## 2013-07-18 MED ORDER — SENNOSIDES-DOCUSATE SODIUM 8.6-50 MG PO TABS
2.0000 | ORAL_TABLET | ORAL | Status: DC
  Administered 2013-07-18: 2 via ORAL
  Filled 2013-07-18: qty 2

## 2013-07-18 MED ORDER — LACTATED RINGERS IV SOLN: 500.0000 mL | INTRAVENOUS | Status: DC | PRN

## 2013-07-18 MED ORDER — TETANUS-DIPHTH-ACELL PERTUSSIS 5-2.5-18.5 LF-MCG/0.5 IM SUSP: 0.5000 mL | Freq: Once | INTRAMUSCULAR | Status: DC

## 2013-07-18 MED ORDER — IBUPROFEN 600 MG PO TABS
600.0000 mg | ORAL_TABLET | Freq: Four times a day (QID) | ORAL | Status: DC
  Administered 2013-07-18 – 2013-07-19 (×4): 600 mg via ORAL
  Filled 2013-07-18 (×4): qty 1

## 2013-07-18 MED ORDER — OXYTOCIN 40 UNITS IN LACTATED RINGERS INFUSION - SIMPLE MED: 62.5000 mL/h | INTRAVENOUS | Status: DC | PRN

## 2013-07-18 MED ORDER — LANOLIN HYDROUS EX OINT: TOPICAL_OINTMENT | CUTANEOUS | Status: DC | PRN

## 2013-07-18 MED ORDER — WITCH HAZEL-GLYCERIN EX PADS: 1.0000 "application " | MEDICATED_PAD | CUTANEOUS | Status: DC | PRN

## 2013-07-18 MED ORDER — EPHEDRINE 5 MG/ML INJ
10.0000 mg | INTRAVENOUS | Status: AC | PRN
  Administered 2013-07-18 (×2): 10 mg via INTRAVENOUS

## 2013-07-18 MED ORDER — EPHEDRINE 5 MG/ML INJ
10.0000 mg | INTRAVENOUS | Status: DC | PRN
  Filled 2013-07-18: qty 4
  Filled 2013-07-18: qty 2

## 2013-07-18 MED ORDER — FENTANYL 2.5 MCG/ML BUPIVACAINE 1/10 % EPIDURAL INFUSION (WH - ANES)
INTRAMUSCULAR | Status: DC | PRN
  Administered 2013-07-18: 14 mL/h via EPIDURAL

## 2013-07-18 MED ORDER — CITRIC ACID-SODIUM CITRATE 334-500 MG/5ML PO SOLN: 30.0000 mL | ORAL | Status: DC | PRN

## 2013-07-18 MED ORDER — FLEET ENEMA 7-19 GM/118ML RE ENEM: 1.0000 | ENEMA | RECTAL | Status: DC | PRN

## 2013-07-18 MED ORDER — PRENATAL MULTIVITAMIN CH
1.0000 | ORAL_TABLET | Freq: Every day | ORAL | Status: DC
  Administered 2013-07-19: 1 via ORAL
  Filled 2013-07-18: qty 1

## 2013-07-18 MED ORDER — FENTANYL 2.5 MCG/ML BUPIVACAINE 1/10 % EPIDURAL INFUSION (WH - ANES)
14.0000 mL/h | INTRAMUSCULAR | Status: DC | PRN
  Filled 2013-07-18: qty 125

## 2013-07-18 MED ORDER — ACETAMINOPHEN 325 MG PO TABS: 650.0000 mg | ORAL_TABLET | ORAL | Status: DC | PRN

## 2013-07-18 MED ORDER — OXYTOCIN BOLUS FROM INFUSION
500.0000 mL | INTRAVENOUS | Status: DC
  Administered 2013-07-18: 500 mL via INTRAVENOUS

## 2013-07-18 NOTE — MAU Note (Signed)
Pt here for labor eval, notes bloody show all day, no gush of fluid. Was 3cm in office Wednesday. Ctx's became more intense around 1400, has been ctxing since 0200 this am.

## 2013-07-18 NOTE — Progress Notes (Signed)
Karla Bauer is a 27 y.o. G2P1001 at [redacted]w[redacted]d by LMP admitted for active labor  Subjective:   Objective: BP 118/81  Pulse 98  Temp(Src) 97.9 F (36.6 C) (Oral)  Resp 20  Ht 5' 4.5" (1.638 m)  Wt 155 lb 4 oz (70.421 kg)  BMI 26.25 kg/m2  SpO2 98%  LMP 10/17/2012  Breastfeeding? No      FHT:  FHR: 140-150 bpm, variability: moderate,  accelerations:  Present,  decelerations:  Absent UC:   regular, every 3 minutes SVE:   Dilation: 10 Effacement (%): 100 Station: +1;+2 Exam by:: B.Cagna,RN  Labs: Lab Results  Component Value Date   WBC 13.0* 07/18/2013   HGB 13.6 07/18/2013   HCT 38.9 07/18/2013   MCV 87.0 07/18/2013   PLT 163 07/18/2013    Assessment / Plan: Spontaneous labor, progressing normally  Labor: Progressing normally Preeclampsia:  n/a Fetal Wellbeing:  Category I Pain Control:  Epidural I/D:  n/a Anticipated MOD:  NSVD  HARPER,CHARLES A 07/18/2013, 8:31 PM

## 2013-07-18 NOTE — Anesthesia Preprocedure Evaluation (Signed)
Anesthesia Evaluation  Patient identified by MRN, date of birth, ID band Patient awake    Reviewed: Allergy & Precautions, H&P , NPO status , Patient's Chart, lab work & pertinent test results  Airway Mallampati: I TM Distance: >3 FB Neck ROM: full    Dental no notable dental hx.    Pulmonary neg pulmonary ROS,    Pulmonary exam normal       Cardiovascular negative cardio ROS      Neuro/Psych negative neurological ROS  negative psych ROS   GI/Hepatic negative GI ROS, Neg liver ROS,   Endo/Other  negative endocrine ROS  Renal/GU negative Renal ROS  negative genitourinary   Musculoskeletal negative musculoskeletal ROS (+)   Abdominal Normal abdominal exam  (+)   Peds  Hematology negative hematology ROS (+)   Anesthesia Other Findings   Reproductive/Obstetrics (+) Pregnancy                           Anesthesia Physical  Anesthesia Plan  ASA: II  Anesthesia Plan: Epidural   Post-op Pain Management:    Induction:   Airway Management Planned:   Additional Equipment:   Intra-op Plan:   Post-operative Plan:   Informed Consent: I have reviewed the patients History and Physical, chart, labs and discussed the procedure including the risks, benefits and alternatives for the proposed anesthesia with the patient or authorized representative who has indicated his/her understanding and acceptance.     Plan Discussed with:   Anesthesia Plan Comments:         Anesthesia Quick Evaluation  

## 2013-07-18 NOTE — MAU Note (Signed)
Patient presents with complaint of contractions 5 minutes apart since 11/2 hours.

## 2013-07-18 NOTE — Anesthesia Procedure Notes (Signed)
Epidural Patient location during procedure: OB Start time: 07/18/2013 6:25 PM End time: 07/18/2013 6:29 PM  Staffing Anesthesiologist: Leilani Able Performed by: anesthesiologist   Preanesthetic Checklist Completed: patient identified, surgical consent, pre-op evaluation, timeout performed, IV checked, risks and benefits discussed and monitors and equipment checked  Epidural Patient position: sitting Prep: site prepped and draped and DuraPrep Patient monitoring: continuous pulse ox and blood pressure Approach: midline Injection technique: LOR air  Needle:  Needle type: Tuohy  Needle gauge: 17 G Needle length: 9 cm and 9 Needle insertion depth: 5 cm cm Catheter type: closed end flexible Catheter size: 19 Gauge Catheter at skin depth: 10 cm Test dose: negative and Other  Assessment Sensory level: T9 Events: blood not aspirated, injection not painful, no injection resistance, negative IV test and no paresthesia  Additional Notes Reason for block:procedure for pain

## 2013-07-18 NOTE — H&P (Signed)
Karla Bauer is a 27 y.o. female presenting for UC's. Maternal Medical History:  Reason for admission: Contractions.  27 yo G2 P1.  EDC 07-24-13.  Presents with UC's.  Fetal activity: Perceived fetal activity is normal.    Prenatal complications: no prenatal complications Prenatal Complications - Diabetes: none.    OB History   Grav Para Term Preterm Abortions TAB SAB Ect Mult Living   2 1 1       1      Past Medical History  Diagnosis Date  . No pertinent past medical history   . H/o Lyme disease   . Lyme disease 2001   Past Surgical History  Procedure Laterality Date  . Plastic surgery on face   2001  . Wisdom tooth extraction     Family History: family history includes Colon cancer in an other family member; Depression in an other family member; Diabetes in her paternal grandmother; Heart attack in her paternal grandfather; Hyperlipidemia in an other family member; Skin cancer in an other family member. Social History:  reports that she has never smoked. She has never used smokeless tobacco. She reports that she does not drink alcohol or use illicit drugs.   Prenatal Transfer Tool  Maternal Diabetes: No Genetic Screening: Normal Maternal Ultrasounds/Referrals: Normal Fetal Ultrasounds or other Referrals:  None Maternal Substance Abuse:  No Significant Maternal Medications:  None Significant Maternal Lab Results:  None Other Comments:  None  Review of Systems  All other systems reviewed and are negative.    Dilation: 6 Effacement (%): 90 Station: -1 Exam by:: Dellie Burns, RN BSN Blood pressure 127/81, pulse 97, temperature 97.9 F (36.6 C), temperature source Oral, resp. rate 18, height 5' 4.5" (1.638 m), weight 155 lb 4 oz (70.421 kg), last menstrual period 10/17/2012, not currently breastfeeding. Maternal Exam:  Uterine Assessment: Contraction strength is moderate.  Abdomen: Patient reports no abdominal tenderness. Fetal presentation: vertex  Introitus:  Normal vulva. Normal vagina.  Pelvis: adequate for delivery.   Cervix: Cervix evaluated by digital exam.     Physical Exam  Nursing note and vitals reviewed. Constitutional: She is oriented to person, place, and time. She appears well-developed and well-nourished.  HENT:  Head: Normocephalic and atraumatic.  Eyes: Conjunctivae are normal. Pupils are equal, round, and reactive to light.  Neck: Normal range of motion. Neck supple.  Cardiovascular: Normal rate and regular rhythm.   Respiratory: Effort normal and breath sounds normal.  GI: Soft.  Genitourinary: Vagina normal and uterus normal.  Musculoskeletal: Normal range of motion.  Neurological: She is alert and oriented to person, place, and time.  Skin: Skin is warm and dry.  Psychiatric: She has a normal mood and affect. Her behavior is normal. Judgment and thought content normal.    Prenatal labs: ABO, Rh: B/POS/-- (04/23 1027) Antibody: NEG (04/23 1027) Rubella: 2.25 (04/23 1027) RPR: NON REAC (04/23 1027)  HBsAg: NEGATIVE (04/23 1027)  HIV: NON REACTIVE (04/23 1027)  GBS: NEGATIVE (11/06 1524)   Assessment/Plan: 39.1 weeks.  Active labor.  Admit.   HARPER,CHARLES A 07/18/2013, 6:19 PM

## 2013-07-18 NOTE — MAU Note (Signed)
To [employer] via W/C.

## 2013-07-18 NOTE — Progress Notes (Signed)
Karla Bauer is a 27 y.o. G2P1001 at [redacted]w[redacted]d by LMP admitted for active labor  Subjective:   Objective: BP 127/81  Pulse 97  Temp(Src) 97.9 F (36.6 C) (Oral)  Resp 18  Ht 5' 4.5" (1.638 m)  Wt 155 lb 4 oz (70.421 kg)  BMI 26.25 kg/m2  LMP 10/17/2012  Breastfeeding? No      FHT:  FHR: 150-160 bpm, variability: moderate,  accelerations:  Present,  decelerations:  Absent UC:   regular, every 2-4 minutes SVE:   Dilation: 6 Effacement (%): 90 Station: -1 Exam by:: Dellie Burns, RN BSN  Labs: Lab Results  Component Value Date   WBC 13.0* 07/18/2013   HGB 13.6 07/18/2013   HCT 38.9 07/18/2013   MCV 87.0 07/18/2013   PLT 163 07/18/2013    Assessment / Plan: Spontaneous labor, progressing normally  Labor: Progressing normally Preeclampsia:  n/a Fetal Wellbeing:  Category I Pain Control:  Epidural I/D:  n/a Anticipated MOD:  NSVD  HARPER,CHARLES A 07/18/2013, 6:23 PM

## 2013-07-19 LAB — CBC
HCT: 33.3 % — ABNORMAL LOW (ref 36.0–46.0)
MCHC: 34.8 g/dL (ref 30.0–36.0)
Platelets: 130 10*3/uL — ABNORMAL LOW (ref 150–400)
RBC: 3.78 MIL/uL — ABNORMAL LOW (ref 3.87–5.11)
RDW: 13.5 % (ref 11.5–15.5)
WBC: 13 10*3/uL — ABNORMAL HIGH (ref 4.0–10.5)

## 2013-07-19 LAB — RPR: RPR Ser Ql: NONREACTIVE

## 2013-07-19 MED ORDER — INFLUENZA VAC SPLIT QUAD 0.5 ML IM SUSP
0.5000 mL | INTRAMUSCULAR | Status: AC
  Administered 2013-07-19: 0.5 mL via INTRAMUSCULAR
  Filled 2013-07-19: qty 0.5

## 2013-07-19 MED ORDER — IBUPROFEN 600 MG PO TABS: 600.0000 mg | ORAL_TABLET | Freq: Four times a day (QID) | ORAL | Status: AC | PRN

## 2013-07-19 NOTE — Lactation Note (Signed)
This note was copied from the chart of Karla Stephaie Dardis. Lactation Consultation Note:Initial visit with mom. Experienced BF mom reports that baby has been nursing well through the night but has been sleepy this morning. Discussed undressing baby completely and watching for feeding cues. [facility] in [facility] office has seen mom this morning. BF brochure given to mom. No questions at present. To call prn  Patient Name: Karla Bauer [id number] Date: 07/19/2013 Reason for consult: Initial assessment   Maternal Data Formula Feeding for Exclusion: No Infant to breast within first hour of birth: Yes Does the patient have breastfeeding experience prior to this delivery?: Yes  Feeding Feeding Type: Breast Fed Length of feed: 30 min  LATCH Score/Interventions Latch: Grasps breast easily, tongue down, lips flanged, rhythmical sucking.  Audible Swallowing: A few with stimulation Intervention(s): Skin to skin  Type of Nipple: Everted at rest and after stimulation  Comfort (Breast/Nipple): Filling, red/small blisters or bruises, mild/mod discomfort  Problem noted: Mild/Moderate discomfort Interventions (Mild/moderate discomfort):  (lanolin, hand expressed br milk to nipples)  Hold (Positioning): Assistance needed to correctly position infant at breast and maintain latch. Intervention(s): Breastfeeding basics reviewed;Skin to skin  LATCH Score: 7  Lactation Tools Discussed/Used     Consult Status Consult Status: Complete    Pamelia Hoit 07/19/2013, 12:25 PM

## 2013-07-19 NOTE — Discharge Summary (Signed)
Obstetric Discharge Summary Reason for Admission: onset of labor Prenatal Procedures: ultrasound Intrapartum Procedures: spontaneous vaginal delivery Postpartum Procedures: none Complications-Operative and Postpartum: none Hemoglobin  Date Value Range Status  07/19/2013 11.6* 12.0 - 15.0 g/dL Final     HCT  Date Value Range Status  07/19/2013 33.3* 36.0 - 46.0 % Final    Physical Exam:  General: alert and no distress Lochia: appropriate Uterine Fundus: firm Incision: None DVT Evaluation: No evidence of DVT seen on physical exam.  Discharge Diagnoses: Term Pregnancy-delivered  Discharge Information: Date: 07/19/2013 Activity: pelvic rest Diet: routine Medications: PNV, Ibuprofen and Colace Condition: stable Instructions: refer to practice specific booklet Discharge to: home Follow-up Information   Follow up with Antionette Char A, MD. Schedule an appointment as soon as possible for a visit in 2 weeks.   Specialty:  Obstetrics and Gynecology   Contact information:   [address] [place] [address] [phone]       Newborn Data: Live born female  Birth Weight: 7 lb 4 oz (3289 g) APGAR: 9, 9  Home with mother.  HARPER,CHARLES A 07/19/2013, 12:25 PM

## 2013-07-19 NOTE — Anesthesia Postprocedure Evaluation (Signed)
Anesthesia Post Note  Patient: Karla Bauer  Procedure(s) Performed: * No procedures listed *  Anesthesia type: Epidural  Patient location: Mother/Baby  Post pain: Pain level controlled  Post assessment: Post-op Vital signs reviewed  Last Vitals: BP 104/66  Pulse 64  Temp(Src) 36.6 C (Oral)  Resp 20  Ht 5' 4.5" (1.638 m)  Wt 155 lb 4 oz (70.421 kg)  BMI 26.25 kg/m2  SpO2 98%  LMP 10/17/2012  Breastfeeding? No  Post vital signs: Reviewed  Level of consciousness: awake  Complications: No apparent anesthesia complications

## 2013-07-22 ENCOUNTER — Encounter (HOSPITAL_COMMUNITY)

## 2013-08-06 ENCOUNTER — Ambulatory Visit (INDEPENDENT_AMBULATORY_CARE_PROVIDER_SITE_OTHER): Admitting: Obstetrics & Gynecology

## 2013-08-06 NOTE — Progress Notes (Signed)
  Subjective:     Karla Bauer is a 27 y.o. female who presents for a postpartum visit. She is 3 weeks postpartum following a spontaneous vaginal delivery. I have fully reviewed the prenatal and intrapartum course. The delivery was at 39 gestational weeks. Outcome: spontaneous vaginal delivery. Anesthesia: epidural. Postpartum course has been going well. Baby's course has been going well. Baby is feeding by breast. Bleeding thin lochia. Bowel function is normal. Bladder function is normal. Patient is not sexually active. Contraception method is none. Postpartum depression screening: negative.  The following portions of the patient's history were reviewed and updated as appropriate: allergies, current medications, past family history, past medical history, past social history, past surgical history and problem list.  Review of Systems Pertinent items are noted in HPI.   Objective:    BP 118/76  Pulse 81  Temp(Src) 97.6 F (36.4 C)  Wt 139 lb (63.05 kg)        Assessment:   Doing well  Plan:   Follow up in: 3 weeks or as needed.

## 2013-08-12 ENCOUNTER — Encounter: Payer: Self-pay | Admitting: Obstetrics & Gynecology

## 2013-08-12 NOTE — Patient Instructions (Signed)
Contraception Choices Contraception (birth control) is the use of any methods or devices to prevent pregnancy. Below are some methods to help avoid pregnancy. HORMONAL METHODS   Contraceptive implant This is a thin, plastic tube containing progesterone hormone. It does not contain estrogen hormone. Your health care provider inserts the tube in the inner part of the upper arm. The tube can remain in place for up to 3 years. After 3 years, the implant must be removed. The implant prevents the ovaries from releasing an egg (ovulation), thickens the cervical mucus to prevent sperm from entering the uterus, and thins the lining of the inside of the uterus.  Progesterone-only injections These injections are given every 3 months by your health care provider to prevent pregnancy. This synthetic progesterone hormone stops the ovaries from releasing eggs. It also thickens cervical mucus and changes the uterine lining. This makes it harder for sperm to survive in the uterus.  Birth control pills These pills contain estrogen and progesterone hormone. They work by preventing the ovaries from releasing eggs (ovulation). They also cause the cervical mucus to thicken, preventing the sperm from entering the uterus. Birth control pills are prescribed by a health care provider.Birth control pills can also be used to treat heavy periods.  Minipill This type of birth control pill contains only the progesterone hormone. They are taken every day of each month and must be prescribed by your health care provider.  Birth control patch The patch contains hormones similar to those in birth control pills. It must be changed once a week and is prescribed by a health care provider.  Vaginal ring The ring contains hormones similar to those in birth control pills. It is left in the vagina for 3 weeks, removed for 1 week, and then a new one is put back in place. The patient must be comfortable inserting and removing the ring from the  vagina.A health care provider's prescription is necessary.  Emergency contraception Emergency contraceptives prevent pregnancy after unprotected sexual intercourse. This pill can be taken right after sex or up to 5 days after unprotected sex. It is most effective the sooner you take the pills after having sexual intercourse. Most emergency contraceptive pills are available without a prescription. Check with your pharmacist. Do not use emergency contraception as your only form of birth control. BARRIER METHODS   Female condom This is a thin sheath (latex or rubber) that is worn over the penis during sexual intercourse. It can be used with spermicide to increase effectiveness.  Female condom. This is a soft, loose-fitting sheath that is put into the vagina before sexual intercourse.  Diaphragm This is a soft, latex, dome-shaped barrier that must be fitted by a health care provider. It is inserted into the vagina, along with a spermicidal jelly. It is inserted before intercourse. The diaphragm should be left in the vagina for 6 to 8 hours after intercourse.  Cervical cap This is a round, soft, latex or plastic cup that fits over the cervix and must be fitted by a health care provider. The cap can be left in place for up to 48 hours after intercourse.  Sponge This is a soft, circular piece of polyurethane foam. The sponge has spermicide in it. It is inserted into the vagina after wetting it and before sexual intercourse.  Spermicides These are chemicals that kill or block sperm from entering the cervix and uterus. They come in the form of creams, jellies, suppositories, foam, or tablets. They do not require a   prescription. They are inserted into the vagina with an applicator before having sexual intercourse. The process must be repeated every time you have sexual intercourse. INTRAUTERINE CONTRACEPTION  Intrauterine device (IUD) This is a T-shaped device that is put in a woman's uterus during a  menstrual period to prevent pregnancy. There are 2 types:  Copper IUD This type of IUD is wrapped in copper wire and is placed inside the uterus. Copper makes the uterus and fallopian tubes produce a fluid that kills sperm. It can stay in place for 10 years.  Hormone IUD This type of IUD contains the hormone progestin (synthetic progesterone). The hormone thickens the cervical mucus and prevents sperm from entering the uterus, and it also thins the uterine lining to prevent implantation of a fertilized egg. The hormone can weaken or kill the sperm that get into the uterus. It can stay in place for 3 5 years, depending on which type of IUD is used. PERMANENT METHODS OF CONTRACEPTION  Female tubal ligation This is when the woman's fallopian tubes are surgically sealed, tied, or blocked to prevent the egg from traveling to the uterus.  Hysteroscopic sterilization This involves placing a small coil or insert into each fallopian tube. Your doctor uses a technique called hysteroscopy to do the procedure. The device causes scar tissue to form. This results in permanent blockage of the fallopian tubes, so the sperm cannot fertilize the egg. It takes about 3 months after the procedure for the tubes to become blocked. You must use another form of birth control for these 3 months.  Female sterilization This is when the female has the tubes that carry sperm tied off (vasectomy).This blocks sperm from entering the vagina during sexual intercourse. After the procedure, the man can still ejaculate fluid (semen). NATURAL PLANNING METHODS  Natural family planning This is not having sexual intercourse or using a barrier method (condom, diaphragm, cervical cap) on days the woman could become pregnant.  Calendar method This is keeping track of the length of each menstrual cycle and identifying when you are fertile.  Ovulation method This is avoiding sexual intercourse during ovulation.  Symptothermal method This is  avoiding sexual intercourse during ovulation, using a thermometer and ovulation symptoms.  Post ovulation method This is timing sexual intercourse after you have ovulated. Regardless of which type or method of contraception you choose, it is important that you use condoms to protect against the transmission of sexually transmitted infections (STIs). Talk with your health care provider about which form of contraception is most appropriate for you. Document Released: 08/06/2005 Document Revised: 04/08/2013 Document Reviewed: 01/29/2013 [employer] Patient Information 2014 [employer].  

## 2013-08-31 ENCOUNTER — Telehealth: Payer: Self-pay | Admitting: *Deleted

## 2013-09-03 ENCOUNTER — Ambulatory Visit (INDEPENDENT_AMBULATORY_CARE_PROVIDER_SITE_OTHER): Admitting: Obstetrics & Gynecology

## 2013-09-03 ENCOUNTER — Encounter: Payer: Self-pay | Admitting: Obstetrics & Gynecology

## 2013-09-03 DIAGNOSIS — O9089 Other complications of the puerperium, not elsewhere classified: Secondary | ICD-10-CM

## 2013-09-03 MED ORDER — AMOXICILLIN-POT CLAVULANATE 875-125 MG PO TABS: 1.0000 | ORAL_TABLET | Freq: Two times a day (BID) | ORAL | Status: AC

## 2013-09-03 NOTE — Patient Instructions (Addendum)
Take antibiotics as instructed Motrin twice a day As needed/ annual exam  Postpartum Hemorrhage Postpartum hemorrhage is excessive blood loss after childbirth. Some blood loss is normal after delivering a baby. However, postpartum hemorrhage is a potentially serious condition.  CAUSES   A loss of muscle tone in the uterus after childbirth.  Failure to deliver all of the placenta.  Wounds in the birth canal caused by delivery of the fetus.  A maternal bleeding disorder that prevents blood clotting (rare). RISK FACTORS You are at greater risk for postpartum hemorrhage if you:  Have a history of postpartum hemorrhage.  Have delivered more than one baby.  Had preeclampsia or eclampsia.  Had problems with the placenta.  Had complications during your labor or delivery.  Are obese.  Are Asian or Hispanic. SIGNS AND SYMPTOMS  Vaginal bleeding after delivery is normal and should be expected. Bleeding (lochia) will occur for several days after childbirth. This can be expected with normal vaginal deliveries and cesarean deliveries.  You are bleeding too much after your delivery if you are:  Passing large clots or pieces of tissue. This may be small pieces of placenta left after delivery.   Soaking more than one sanitary pad per hour for several hours.   Having heavy, bright-red bleeding that occurs 4 days or more after delivery.   Having a discharge that has a bad smell or if you begin to run an unexplained fever.   Having times of lightheadedness or fainting, feeling short of breath, or having your heart beat fast with very little activity.  DIAGNOSIS  A diagnosis is based on your symptoms and a physical exam of your perineum, vagina, cervix, and uterus. Diagnostic tests may include:  Blood pressure and pulse.  Blood tests.  Blood clotting tests.  Ultrasonography. TREATMENT  Treatment is based on the severity of bleeding and may include:  Uterine  massage.  Medicines.  Blood transfusions.  Sometimes bleeding occurs if portions of the placenta are left behind in the uterus after delivery. If this happens, often a curettage or scraping of the inside of the uterus must be done. This usually stops the bleeding. If this treatment does not stop the bleeding, surgery (hysterectomy) may have to be performed to remove the uterus.  If bleeding is due to clotting or bleeding problems that are not related to the pregnancy, other treatments may be needed.  HOME CARE INSTRUCTIONS   Limit your activity as directed by your health care provider.Your health care provider may order bed rest (getting up to the bathroom only) or may allow you to continue light activity.   Keep track of the number of pads you use each day and how soaked (saturated) they are. Write this number down.   Do not use tampons. Do not douche or have sexual intercourse until approved by your health care provider.   Drink enough fluids to keep your urine clear or pale yellow.   Get proper amounts of rest.   Eat foods that are rich in iron, such as spinach, red meat, and legumes.  SEEK IMMEDIATE MEDICAL CARE IF:  You experience severe cramps in your stomach, back, or belly (abdomen).   You have a fever.   You pass large clots or tissue. Save any tissue for your health care provider to look at.   Your bleeding increases.  You become weak or lightheaded, or you pass out.   Your sanitary pad count per hour is increasing. Document Released: 10/27/2003 Document Revised: 04/08/2013  Document Reviewed: 01/22/2013 [facility] Patient Information 2014 [place], [place].

## 2013-09-03 NOTE — Progress Notes (Signed)
Subjective:      Karla Bauer is a 28 y.o. female who presents for a postpartum visit. She is 6 weeks postpartum following a spontaneous vaginal delivery. I have fully reviewed the prenatal and intrapartum course. The delivery was at 39 gestational weeks. Outcome: spontaneous vaginal delivery. Anesthesia: epidural. Postpartum course has been state she has done fine. Baby's course has been no problems. Baby is feeding by both breast and bottle - breast milk. Bleeding scant brown. Bowel function is normal. Bladder function is normal. Patient is not sexually active. Contraception method is abstinence and condoms. Postpartum depression screening: negative.  The following portions of the patient's history were reviewed and updated as appropriate: allergies, past family history, past medical history, past social history, past surgical history and problem list.  Review of Systems Pertinent items are noted in HPI.  Objective:    BP 111/74  Pulse 68  Temp(Src) 98.7 F (37.1 C)  Ht 5\' 5"  (1.651 m)  Wt 138 lb (62.596 kg)  BMI 22.96 kg/m2  Breastfeeding? Yes  General:  alert     Abdomen: soft, non-tender; bowel sounds normal; no masses,  no organomegaly   Vulva:  normal  Vagina: normal vagina  Cervix:  no lesions  Corpus: normal size, contour, position, consistency, mobility, non-tender  Adnexa:  normal adnexa      Assessment:    Postpartum exam normal. Pap smear not done at today's vis it.  Plan:   1. Contraception: condoms 2. Augmentin 875 bid x 10 days 3. Follow up in: as needed/annual exam. 4. Consider ultrasound and H&H if bleeding is unresolved

## 2013-09-05 DIAGNOSIS — O9089 Other complications of the puerperium, not elsewhere classified: Secondary | ICD-10-CM | POA: Insufficient documentation

## 2013-09-08 ENCOUNTER — Encounter: Payer: Self-pay | Admitting: Obstetrics

## 2013-09-08 ENCOUNTER — Ambulatory Visit (INDEPENDENT_AMBULATORY_CARE_PROVIDER_SITE_OTHER): Admitting: Obstetrics

## 2013-09-08 VITALS — BP 105/72 | HR 106 | Temp 97.1°F | Ht 65.0 in | Wt 140.0 lb

## 2013-09-08 DIAGNOSIS — Z880 Allergy status to penicillin: Secondary | ICD-10-CM

## 2013-09-08 MED ORDER — PREDNISONE (PAK) 10 MG PO TABS: ORAL_TABLET | ORAL | Status: AC

## 2013-09-08 NOTE — Progress Notes (Signed)
Subjective:     Karla Bauer is a 28 y.o. female who presents for a postpartum visit. She is 7 weeks postpartum following a spontaneous vaginal delivery. I have fully reviewed the prenatal and intrapartum course. The delivery was at 39 gestational weeks. Outcome: spontaneous vaginal delivery. Anesthesia: epidural. Postpartum course has been complicated by prolonged bleeding and reaction to antibiotics given. Baby's course has been WNL. Baby is feeding by breast. Bleeding no bleeding. Bowel function is normal. Bladder function is normal. Patient is not sexually active. Contraception method is abstinence. Postpartum depression screening: negative.  The following portions of the patient's history were reviewed and updated as appropriate: allergies, current medications, past family history, past medical history, past social history, past surgical history and problem list.  Review of Systems Pertinent items are noted in HPI.   Objective:    BP 105/72  Pulse 106  Temp(Src) 97.1 F (36.2 C)  Ht 5\' 5"  (1.651 m)  Wt 140 lb (63.504 kg)  BMI 23.30 kg/m2  Breastfeeding? Yes  General:  alert and no distress PE:        Breasts:  Negative      Abdomen:  Soft, NT.      Uterus NSSC, NT.   Assessment:     Normal postpartum exam. Pap smear not done at today's visit.    Amoxicillin allergy with generalized rash present.  Plan:    1. Contraception: condoms 2. Stera Pred DS Dose-Pak Rx. 3. Follow up in: Several months for Pap Smear.

## 2013-12-18 IMAGING — US US OB COMP +14 WK
1 series · 12 of 28 positions shown · non-contrast
Comparison: none

[Series 1: us ob comp +14 wk · 12 of 87 slices shown]
[im 4/87]
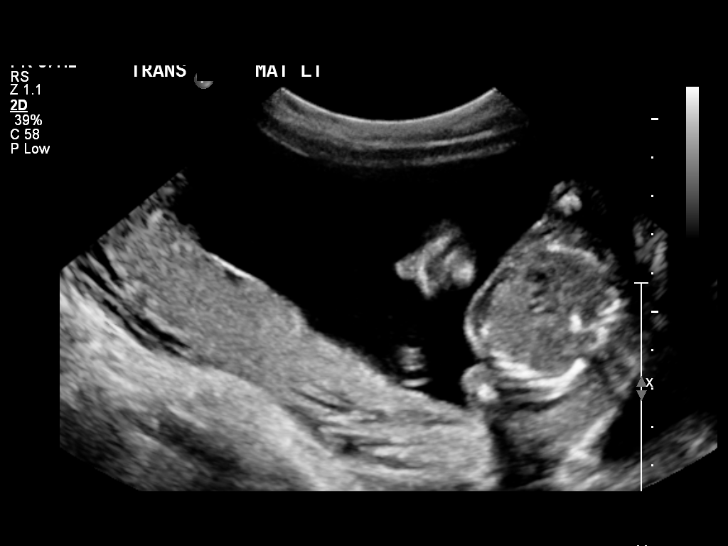
[im 10/87]
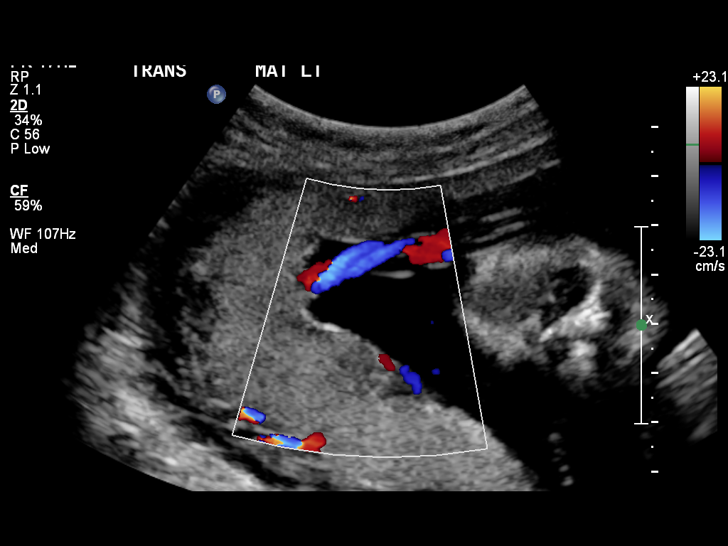
[im 16/87]
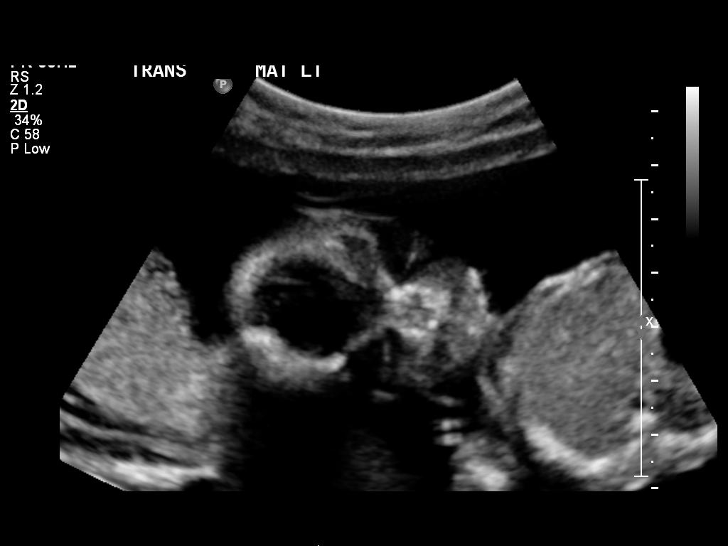
[im 26/87]
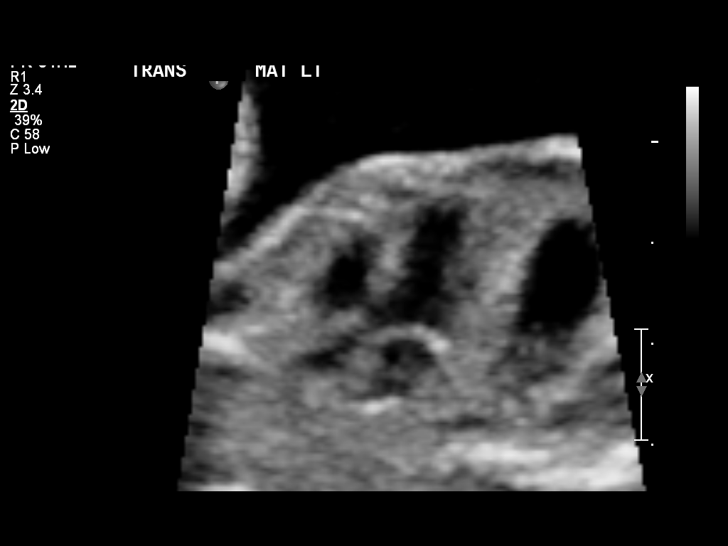
[im 32/87]
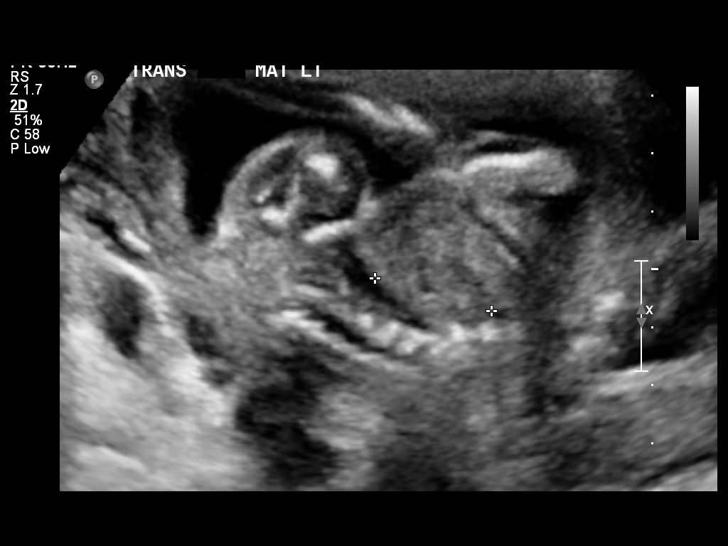
[im 39/87]
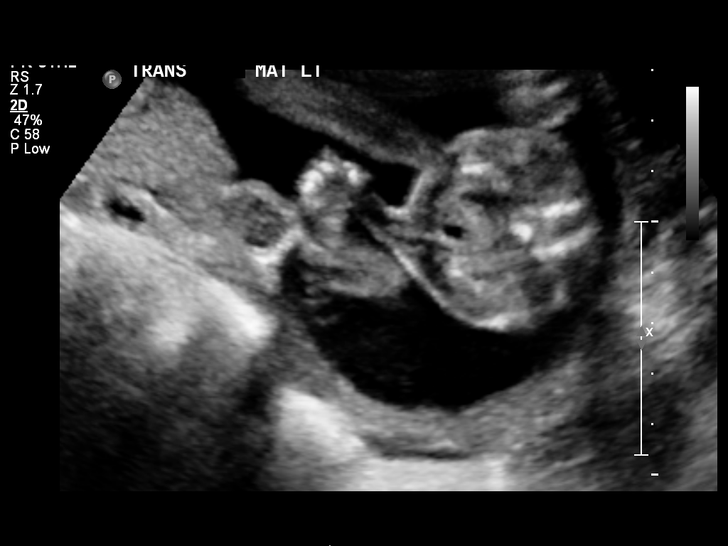
[im 48/87]
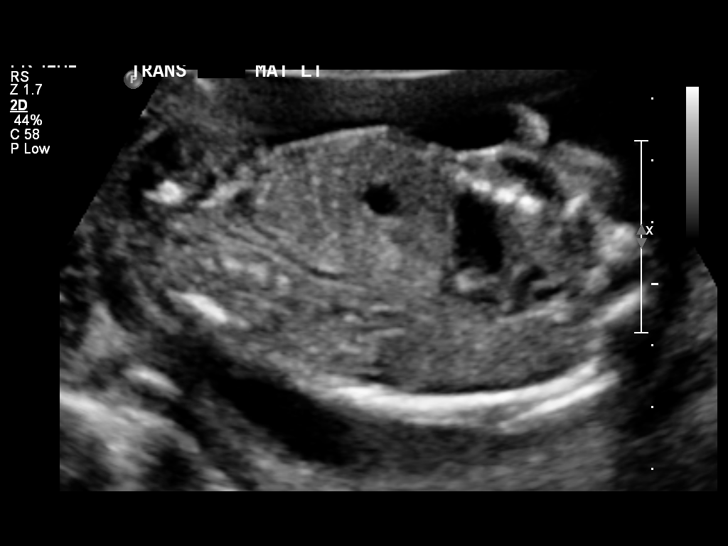
[im 55/87]
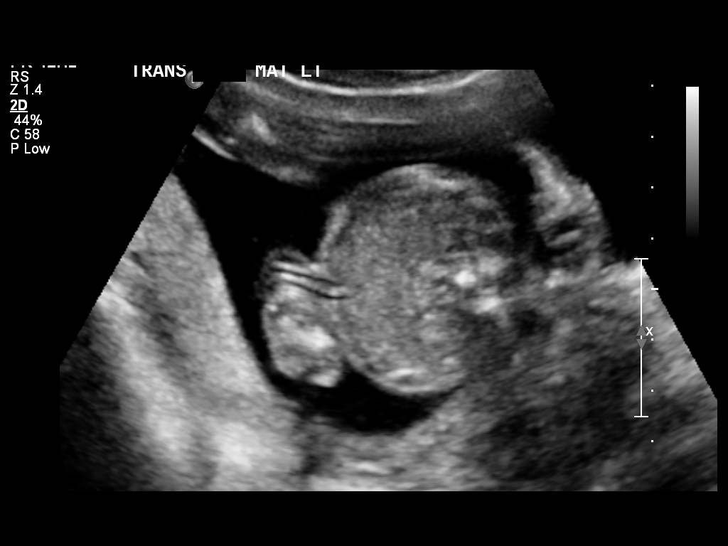
[im 61/87]
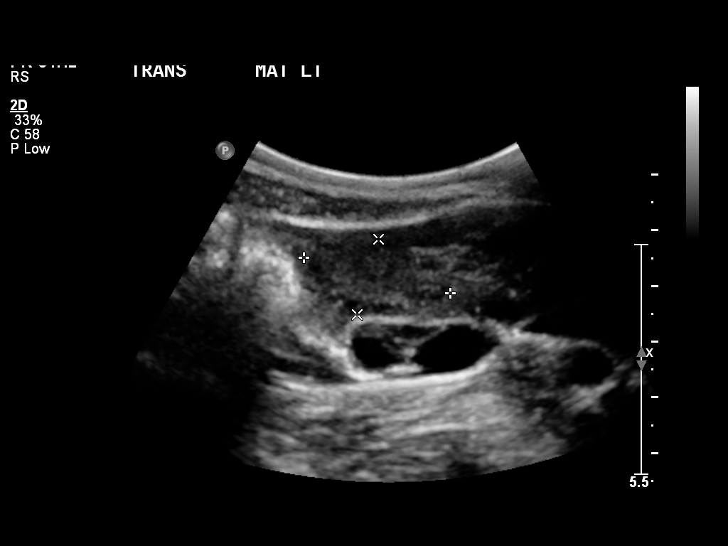
[im 71/87]
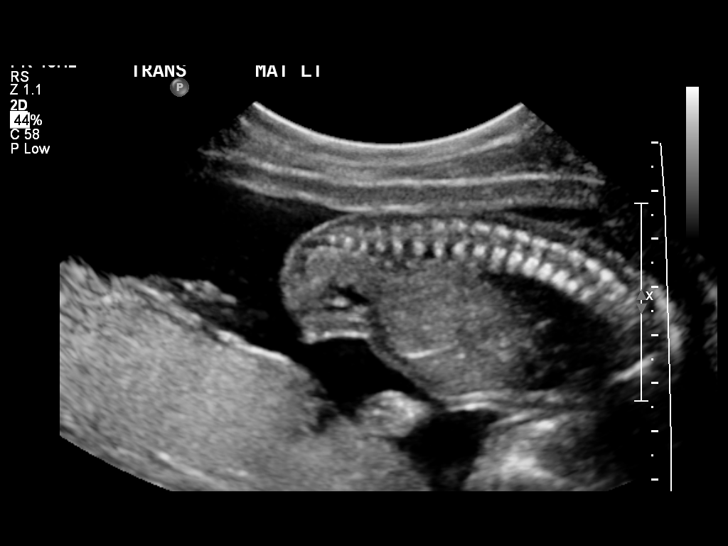
[im 77/87]
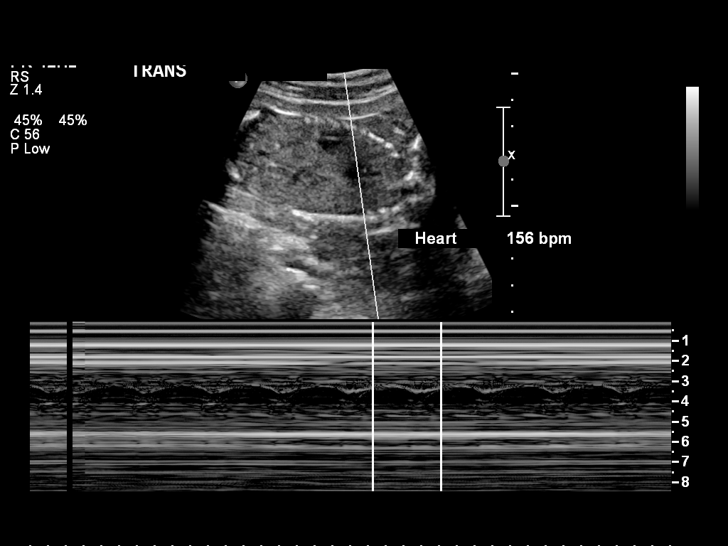
[im 83/87]
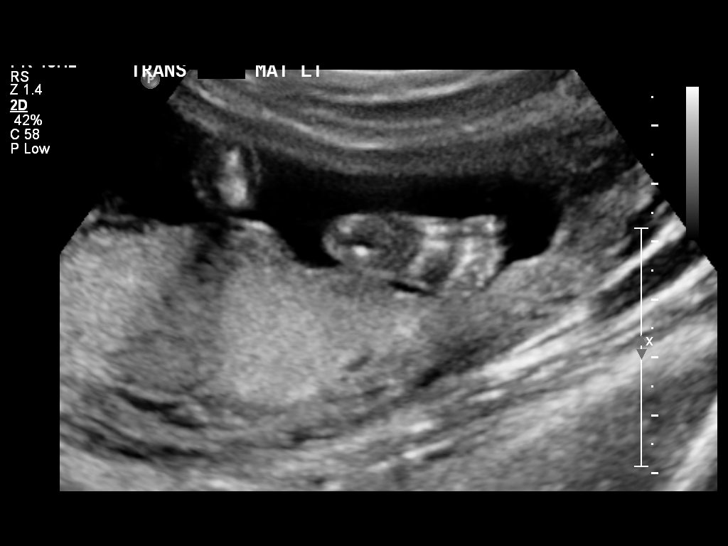

[12 of 28 positions shown; findings below may reference images not displayed]

OBSTETRICS REPORT
                      (Signed Final 02/27/2013 [DATE])

Service(s) Provided

 US OB COMP + 14 WK                                    76805.1
Indications

 Basic anatomic survey
Fetal Evaluation

 Num Of Fetuses:    1
 Fetal Heart Rate:  156                         bpm
 Cardiac Activity:  Observed
 Presentation:      Transverse, head to
                    maternal left
 Placenta:          Posterior Fundal, above
                    cervical os
 P. Cord            Visualized, central
 Insertion:

 Amniotic Fluid
 AFI FV:      Subjectively within normal limits
                                             Larg Pckt:     2.7  cm
Biometry

 BPD:     43.9  mm    G. Age:   19w 2d                CI:        73.73   70 - 86
                                                      FL/HC:      19.4   16.1 -

 HC:     162.4  mm    G. Age:   19w 0d       44  %    HC/AC:      1.12   1.09 -

 AC:     144.7  mm    G. Age:   19w 5d       72  %    FL/BPD:
 FL:      31.5  mm    G. Age:   19w 6d       71  %    FL/AC:      21.8   20 - 24
 NFT:     2.11  mm

 Est. FW:     308  gm    0 lb 11 oz      56  %
Gestational Age

 LMP:           19w 0d       Date:   10/17/12                 EDD:   07/24/13
 U/S Today:     19w 3d                                        EDD:   07/21/13
 Best:          19w 0d    Det. By:   LMP  (10/17/12)          EDD:   07/24/13
Anatomy

 Cranium:          Appears normal         Aortic Arch:      Basic anatomy
                                                            exam per order
 Fetal Cavum:      Appears normal         Ductal Arch:      Basic anatomy
                                                            exam per order
 Ventricles:       Appears normal         Diaphragm:        Appears normal
 Choroid Plexus:   Appears normal         Stomach:          Appears normal, left
                                                            sided
 Cerebellum:       Appears normal         Abdomen:          Appears normal
 Posterior Fossa:  Appears normal         Abdominal Wall:   Appears nml (cord
                                                            insert, abd wall)
 Nuchal Fold:      Appears normal         Cord Vessels:     Appears normal (3
                                                            vessel cord)
 Face:             Appears normal         Kidneys:          Appear normal
                   (orbits and profile)
 Lips:             Appears normal         Bladder:          Appears normal
 Palate:           Not well visualized    Spine:            Appears normal
 Heart:            Appears normal         Lower             Appears normal
                   (4CH, axis, and        Extremities:
                   situs)
 RVOT:             Appears normal         Upper             Appears normal
                                          Extremities:
 LVOT:             Appears normal

 Other:  Parents do not wish to know sex of fetus. Female gender.
Cervix Uterus Adnexa

 Cervical Length:   3.4       cm

 Cervix:       Normal appearance by transabdominal scan.

 Left Ovary:   Not visualized.
 Right Ovary:  Within normal limits.
 Adnexa:     No abnormality visualized.
Impression

 Single intrauterine gestation demonstrating an estimated
 gestational age by ultrasound of 19w 3d. This is correlated
 with expected estimated gestational age by LMP of 19w 0d.
 EFW is currently at the 56%.

 Visualized fetal anatomy appears normal. No focal placental
 abnormality is seen.

 Subjectively and quantitatively normal amniotic fluid volume.

 Normal cervical length and appearance.

 questions or concerns.

## 2014-06-21 ENCOUNTER — Encounter: Payer: Self-pay | Admitting: Obstetrics

## 2014-08-16 ENCOUNTER — Encounter: Payer: Self-pay | Admitting: *Deleted
